# Patient Record
Sex: Male | Born: 1980 | Race: White | Hispanic: No | Marital: Single | Smoking: Never smoker
Health system: Southern US, Community
[De-identification: ages and names within clinical notes are randomized; demographics above are authoritative.]

## PROBLEM LIST (undated history)

## (undated) DIAGNOSIS — F339 Major depressive disorder, recurrent, unspecified: Secondary | ICD-10-CM

## (undated) DIAGNOSIS — B081 Molluscum contagiosum: Secondary | ICD-10-CM

## (undated) DIAGNOSIS — B009 Herpesviral infection, unspecified: Secondary | ICD-10-CM

## (undated) DIAGNOSIS — F1290 Cannabis use, unspecified, uncomplicated: Secondary | ICD-10-CM

## (undated) DIAGNOSIS — B20 Human immunodeficiency virus [HIV] disease: Secondary | ICD-10-CM

## (undated) DIAGNOSIS — Z21 Asymptomatic human immunodeficiency virus [HIV] infection status: Secondary | ICD-10-CM

## (undated) DIAGNOSIS — I10 Essential (primary) hypertension: Secondary | ICD-10-CM

## (undated) HISTORY — DX: Molluscum contagiosum: B08.1

## (undated) HISTORY — DX: Cannabis use, unspecified, uncomplicated: F12.90

## (undated) HISTORY — DX: Asymptomatic human immunodeficiency virus (hiv) infection status: Z21

## (undated) HISTORY — DX: Herpesviral infection, unspecified: B00.9

## (undated) HISTORY — DX: Human immunodeficiency virus (HIV) disease: B20

## (undated) HISTORY — DX: Major depressive disorder, recurrent, unspecified: F33.9

## (undated) HISTORY — DX: Essential (primary) hypertension: I10

---

## 2000-03-11 ENCOUNTER — Emergency Department (HOSPITAL_COMMUNITY): Admission: EM | Admit: 2000-03-11 | Discharge: 2000-03-11 | Payer: Self-pay

## 2000-03-13 ENCOUNTER — Emergency Department (HOSPITAL_COMMUNITY): Admission: EM | Admit: 2000-03-13 | Discharge: 2000-03-13 | Payer: Self-pay

## 2007-03-11 ENCOUNTER — Ambulatory Visit: Payer: Self-pay | Admitting: Infectious Disease

## 2007-03-11 ENCOUNTER — Encounter: Admission: RE | Admit: 2007-03-11 | Discharge: 2007-03-11 | Payer: Self-pay | Admitting: Infectious Disease

## 2007-03-11 DIAGNOSIS — B20 Human immunodeficiency virus [HIV] disease: Secondary | ICD-10-CM | POA: Insufficient documentation

## 2007-03-11 LAB — CONVERTED CEMR LAB
ALT: 16 units/L (ref 0–53)
AST: 18 units/L (ref 0–37)
Albumin: 4.8 g/dL (ref 3.5–5.2)
Alkaline Phosphatase: 61 units/L (ref 39–117)
BUN: 12 mg/dL (ref 6–23)
Basophils Absolute: 0 10*3/uL (ref 0.0–0.1)
Basophils Relative: 0 % (ref 0–1)
Bilirubin Urine: NEGATIVE
CD4 T Cell Abs: 196
CO2: 26 meq/L (ref 19–32)
Calcium: 9.3 mg/dL (ref 8.4–10.5)
Chlamydia, Swab/Urine, PCR: NEGATIVE
Chloride: 106 meq/L (ref 96–112)
Creatinine, Ser: 0.87 mg/dL (ref 0.40–1.50)
Eosinophils Absolute: 0.1 10*3/uL (ref 0.0–0.7)
Eosinophils Relative: 3 % (ref 0–5)
GC Probe Amp, Urine: NEGATIVE
Glucose, Bld: 78 mg/dL (ref 70–99)
HCT: 48 % (ref 39.0–52.0)
HCV Ab: NEGATIVE
HCV Ab: NEGATIVE
HIV 1 RNA Quant: 1220 copies/mL — ABNORMAL HIGH (ref <50)
HIV-1 RNA Quant, Log: 3.09 — ABNORMAL HIGH (ref <1.70)
HIV-1 antibody: POSITIVE — AB
HIV-2 Ab: NEGATIVE
HIV: REACTIVE
Hemoglobin, Urine: NEGATIVE
Hemoglobin: 16.8 g/dL (ref 13.0–17.0)
Hep B Core Total Ab: NEGATIVE
Hep B S Ab: NEGATIVE
Hep B S Ab: NEGATIVE
Hepatitis B Surface Ag: NEGATIVE
Hepatitis B Surface Ag: NEGATIVE
Ketones, ur: NEGATIVE mg/dL
Leukocytes, UA: NEGATIVE
Lymphocytes Relative: 26 % (ref 12–46)
Lymphs Abs: 1.1 10*3/uL (ref 0.7–3.3)
MCHC: 35 g/dL (ref 30.0–36.0)
MCV: 86.8 fL (ref 78.0–100.0)
Monocytes Absolute: 0.6 10*3/uL (ref 0.2–0.7)
Monocytes Relative: 13 % — ABNORMAL HIGH (ref 3–11)
Neutro Abs: 2.6 10*3/uL (ref 1.7–7.7)
Neutrophils Relative %: 59 % (ref 43–77)
Nitrite: NEGATIVE
Platelets: 184 10*3/uL (ref 150–400)
Potassium: 4.1 meq/L (ref 3.5–5.3)
Protein, ur: NEGATIVE mg/dL
RBC: 5.53 M/uL (ref 4.22–5.81)
RDW: 12.8 % (ref 11.5–14.0)
Sodium: 141 meq/L (ref 135–145)
Specific Gravity, Urine: 1.024 (ref 1.005–1.03)
Total Bilirubin: 0.6 mg/dL (ref 0.3–1.2)
Total Protein: 7.9 g/dL (ref 6.0–8.3)
Urine Glucose: NEGATIVE mg/dL
Urobilinogen, UA: 1 (ref 0.0–1.0)
WBC: 4.4 10*3/uL (ref 4.0–10.5)
pH: 7.5 (ref 5.0–8.0)

## 2007-04-01 ENCOUNTER — Ambulatory Visit: Payer: Self-pay | Admitting: Infectious Disease

## 2007-04-01 ENCOUNTER — Ambulatory Visit (HOSPITAL_COMMUNITY): Admission: RE | Admit: 2007-04-01 | Discharge: 2007-04-01 | Payer: Self-pay | Admitting: Infectious Disease

## 2007-04-01 DIAGNOSIS — R05 Cough: Secondary | ICD-10-CM

## 2007-04-01 DIAGNOSIS — L659 Nonscarring hair loss, unspecified: Secondary | ICD-10-CM | POA: Insufficient documentation

## 2007-04-01 DIAGNOSIS — F320 Major depressive disorder, single episode, mild: Secondary | ICD-10-CM

## 2007-04-01 LAB — CONVERTED CEMR LAB: TSH: 0.492 microintl units/mL (ref 0.350–5.50)

## 2007-04-08 ENCOUNTER — Telehealth: Payer: Self-pay | Admitting: Infectious Disease

## 2007-04-12 ENCOUNTER — Ambulatory Visit: Payer: Self-pay | Admitting: Infectious Disease

## 2007-04-26 ENCOUNTER — Telehealth: Payer: Self-pay | Admitting: Infectious Disease

## 2007-05-07 ENCOUNTER — Ambulatory Visit: Payer: Self-pay | Admitting: Infectious Disease

## 2007-05-07 DIAGNOSIS — L259 Unspecified contact dermatitis, unspecified cause: Secondary | ICD-10-CM

## 2007-05-07 DIAGNOSIS — R634 Abnormal weight loss: Secondary | ICD-10-CM

## 2007-05-19 ENCOUNTER — Encounter: Payer: Self-pay | Admitting: Infectious Disease

## 2007-06-24 ENCOUNTER — Telehealth: Payer: Self-pay | Admitting: Infectious Disease

## 2007-07-07 ENCOUNTER — Ambulatory Visit: Payer: Self-pay | Admitting: Infectious Disease

## 2007-07-07 ENCOUNTER — Encounter: Admission: RE | Admit: 2007-07-07 | Discharge: 2007-07-07 | Payer: Self-pay | Admitting: Infectious Disease

## 2007-07-07 LAB — CONVERTED CEMR LAB
Albumin: 5 g/dL (ref 3.5–5.2)
Alkaline Phosphatase: 74 units/L (ref 39–117)
BUN: 13 mg/dL (ref 6–23)
Bilirubin Urine: NEGATIVE
CO2: 25 meq/L (ref 19–32)
Calcium: 9.3 mg/dL (ref 8.4–10.5)
Cholesterol: 210 mg/dL — ABNORMAL HIGH (ref 0–200)
Eosinophils Absolute: 0.1 10*3/uL (ref 0.0–0.7)
Glucose, Bld: 122 mg/dL — ABNORMAL HIGH (ref 70–99)
HCT: 49 % (ref 39.0–52.0)
HDL: 50 mg/dL (ref >39)
HIV 1 RNA Quant: <50 copies/mL (ref <50)
HIV-1 RNA Quant, Log: <1.7 (ref <1.70)
Ketones, ur: NEGATIVE mg/dL
Lymphocytes Relative: 25 % (ref 12–46)
Lymphs Abs: 1.1 10*3/uL (ref 0.7–4.0)
MCV: 90.4 fL (ref 78.0–100.0)
Monocytes Relative: 8 % (ref 3–12)
Neutrophils Relative %: 64 % (ref 43–77)
Platelets: 201 10*3/uL (ref 150–400)
Potassium: 4.1 meq/L (ref 3.5–5.3)
RBC: 5.42 M/uL (ref 4.22–5.81)
Sodium: 140 meq/L (ref 135–145)
Specific Gravity, Urine: 1.017 (ref 1.005–1.03)
Total Protein: 7.9 g/dL (ref 6.0–8.3)
Triglycerides: 148 mg/dL (ref <150)
Urine Glucose: NEGATIVE mg/dL
Urobilinogen, UA: 0.2 (ref 0.0–1.0)
WBC: 4.5 10*3/uL (ref 4.0–10.5)
pH: 6.5 (ref 5.0–8.0)

## 2007-08-23 ENCOUNTER — Encounter (INDEPENDENT_AMBULATORY_CARE_PROVIDER_SITE_OTHER): Payer: Self-pay | Admitting: *Deleted

## 2007-08-23 ENCOUNTER — Encounter: Admission: RE | Admit: 2007-08-23 | Discharge: 2007-08-23 | Payer: Self-pay | Admitting: Infectious Disease

## 2007-08-23 ENCOUNTER — Ambulatory Visit: Payer: Self-pay | Admitting: Infectious Disease

## 2007-08-23 LAB — CONVERTED CEMR LAB
ALT: 16 units/L (ref 0–53)
Basophils Absolute: 0 10*3/uL (ref 0.0–0.1)
CO2: 23 meq/L (ref 19–32)
Calcium: 9.1 mg/dL (ref 8.4–10.5)
Chloride: 106 meq/L (ref 96–112)
Creatinine, Ser: 1.1 mg/dL (ref 0.40–1.50)
Glucose, Bld: 114 mg/dL — ABNORMAL HIGH (ref 70–99)
HCT: 45.9 % (ref 39.0–52.0)
HIV 1 RNA Quant: 58 copies/mL — ABNORMAL HIGH (ref <50)
HIV-1 RNA Quant, Log: 1.76 — ABNORMAL HIGH (ref <1.70)
Hemoglobin: 15.7 g/dL (ref 13.0–17.0)
Lymphocytes Relative: 27 % (ref 12–46)
Lymphs Abs: 1 10*3/uL (ref 0.7–4.0)
Monocytes Absolute: 0.4 10*3/uL (ref 0.1–1.0)
Neutro Abs: 2.4 10*3/uL (ref 1.7–7.7)
RBC: 5.04 M/uL (ref 4.22–5.81)
RDW: 12.9 % (ref 11.5–15.5)
Total Protein: 7.2 g/dL (ref 6.0–8.3)
WBC: 3.8 10*3/uL — ABNORMAL LOW (ref 4.0–10.5)

## 2007-09-09 ENCOUNTER — Encounter (INDEPENDENT_AMBULATORY_CARE_PROVIDER_SITE_OTHER): Payer: Self-pay | Admitting: *Deleted

## 2008-01-10 ENCOUNTER — Ambulatory Visit: Payer: Self-pay | Admitting: Infectious Disease

## 2008-01-10 ENCOUNTER — Encounter: Admission: RE | Admit: 2008-01-10 | Discharge: 2008-01-10 | Payer: Self-pay | Admitting: Infectious Disease

## 2008-01-10 LAB — CONVERTED CEMR LAB
ALT: 14 units/L (ref 0–53)
AST: 15 units/L (ref 0–37)
Albumin: 4.9 g/dL (ref 3.5–5.2)
Basophils Absolute: 0 10*3/uL (ref 0.0–0.1)
Basophils Relative: 1 % (ref 0–1)
Calcium: 9.2 mg/dL (ref 8.4–10.5)
Chloride: 105 meq/L (ref 96–112)
HIV 1 RNA Quant: 53 copies/mL — ABNORMAL HIGH (ref <50)
HIV-1 RNA Quant, Log: 1.72 — ABNORMAL HIGH (ref <1.70)
Lymphocytes Relative: 24 % (ref 12–46)
MCHC: 35.5 g/dL (ref 30.0–36.0)
Neutro Abs: 2.7 10*3/uL (ref 1.7–7.7)
Neutrophils Relative %: 62 % (ref 43–77)
Potassium: 4.1 meq/L (ref 3.5–5.3)
RDW: 12.5 % (ref 11.5–15.5)
Sodium: 140 meq/L (ref 135–145)
Total CHOL/HDL Ratio: 3.7
Total Protein: 7.4 g/dL (ref 6.0–8.3)

## 2008-01-24 ENCOUNTER — Ambulatory Visit: Payer: Self-pay | Admitting: Infectious Disease

## 2008-06-11 IMAGING — CR DG CHEST 2V
2 series · 2 of 2 positions shown · non-contrast
Comparison: none

CLINICAL DATA: Productive cough for several weeks. 
 CHEST - 2 VIEW:

[w chest pa]
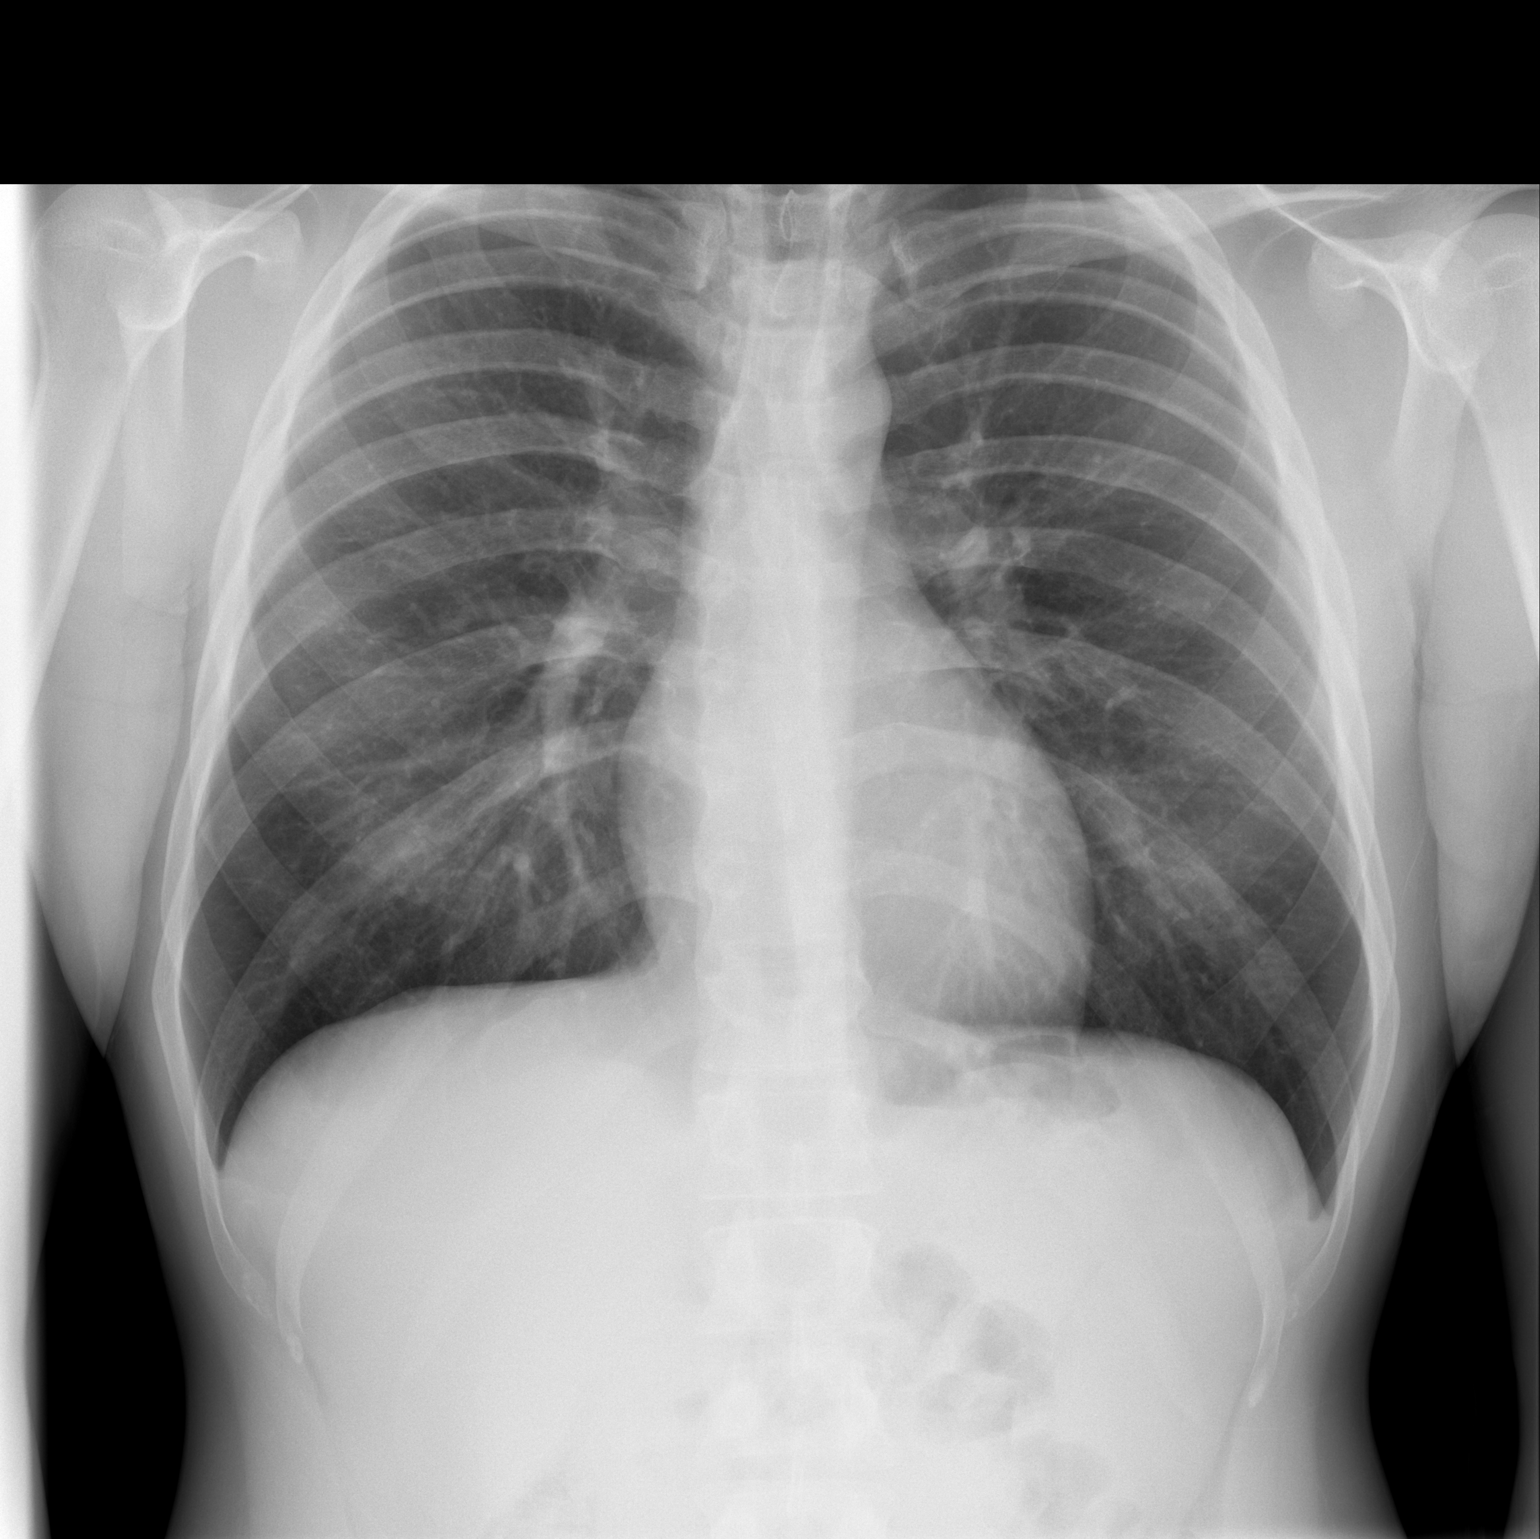

[w chest lat]
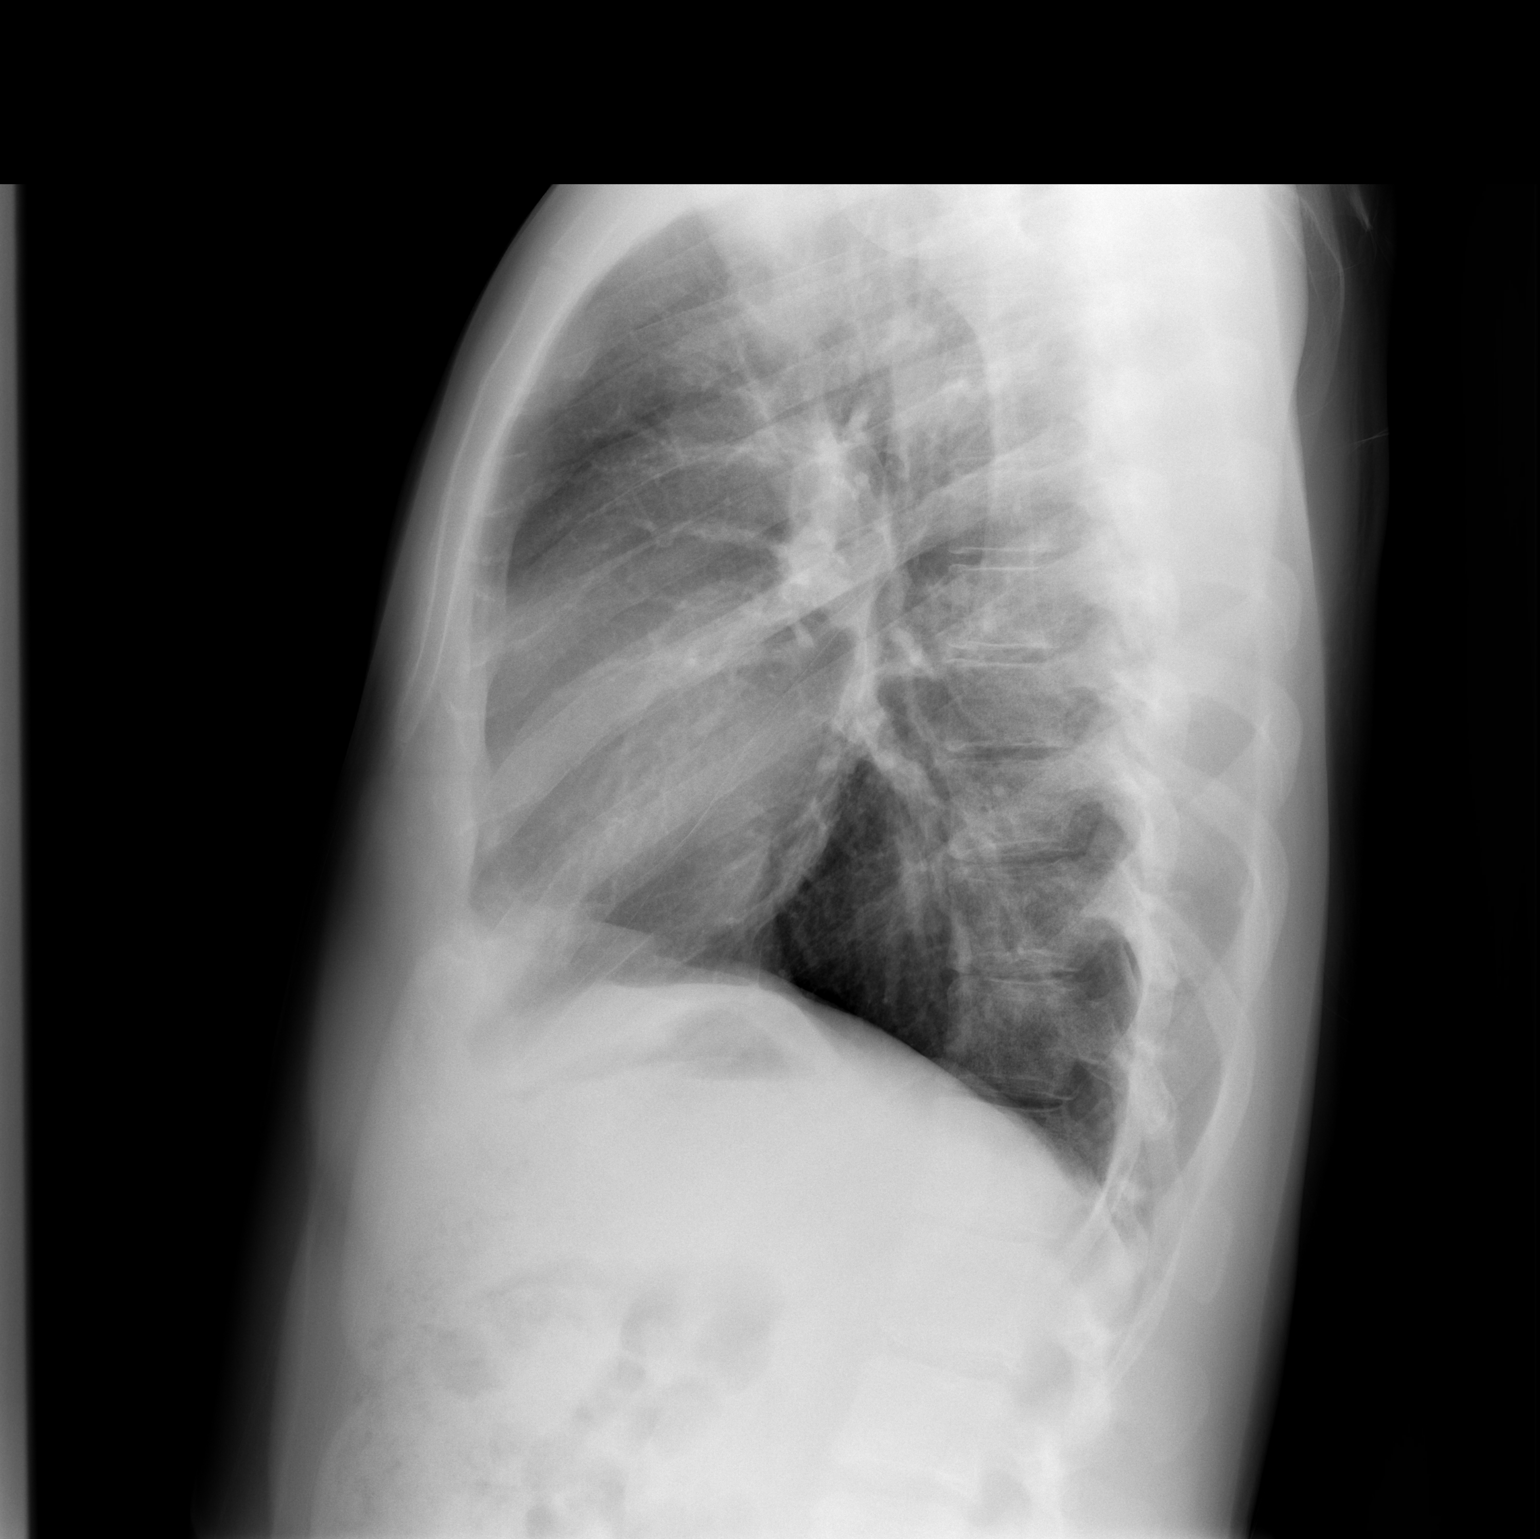

[2 of 2 positions shown; findings below may reference images not displayed]

FINDINGS: Two views of the chest show no active infiltrate or effusion. No adenopathy is seen.  The heart is within normal limits in size.
IMPRESSION: No active lung disease.

## 2008-06-19 ENCOUNTER — Ambulatory Visit: Payer: Self-pay | Admitting: Infectious Disease

## 2008-06-19 ENCOUNTER — Encounter (INDEPENDENT_AMBULATORY_CARE_PROVIDER_SITE_OTHER): Payer: Self-pay | Admitting: *Deleted

## 2008-06-19 LAB — CONVERTED CEMR LAB
ALT: 10 units/L (ref 0–53)
Basophils Absolute: 0 10*3/uL (ref 0.0–0.1)
CO2: 26 meq/L (ref 19–32)
Calcium: 9.2 mg/dL (ref 8.4–10.5)
Chloride: 105 meq/L (ref 96–112)
Creatinine, Ser: 0.93 mg/dL (ref 0.40–1.50)
HIV 1 RNA Quant: 57 copies/mL — ABNORMAL HIGH (ref <48)
Hemoglobin: 16.3 g/dL (ref 13.0–17.0)
Lymphocytes Relative: 26 % (ref 12–46)
Monocytes Absolute: 0.5 10*3/uL (ref 0.1–1.0)
Neutro Abs: 2.7 10*3/uL (ref 1.7–7.7)
RBC: 5.27 M/uL (ref 4.22–5.81)
RDW: 12.7 % (ref 11.5–15.5)
Total Protein: 7.4 g/dL (ref 6.0–8.3)
WBC: 4.5 10*3/uL (ref 4.0–10.5)

## 2008-07-12 ENCOUNTER — Encounter (INDEPENDENT_AMBULATORY_CARE_PROVIDER_SITE_OTHER): Payer: Self-pay | Admitting: *Deleted

## 2008-11-09 ENCOUNTER — Ambulatory Visit: Payer: Self-pay | Admitting: Infectious Disease

## 2008-11-09 LAB — CONVERTED CEMR LAB
AST: 17 units/L (ref 0–37)
Alkaline Phosphatase: 63 units/L (ref 39–117)
BUN: 12 mg/dL (ref 6–23)
Creatinine, Ser: 1.42 mg/dL (ref 0.40–1.50)
Eosinophils Absolute: 0.1 10*3/uL (ref 0.0–0.7)
Eosinophils Relative: 2 % (ref 0–5)
HCT: 43.5 % (ref 39.0–52.0)
HIV-1 RNA Quant, Log: <1.68 (ref <1.68)
Lymphs Abs: 1.2 10*3/uL (ref 0.7–4.0)
MCV: 89.7 fL (ref 78.0–100.0)
Monocytes Relative: 10 % (ref 3–12)
Potassium: 4.1 meq/L (ref 3.5–5.3)
RBC: 4.85 M/uL (ref 4.22–5.81)
Total Bilirubin: 0.3 mg/dL (ref 0.3–1.2)
WBC: 4.5 10*3/uL (ref 4.0–10.5)

## 2008-11-27 ENCOUNTER — Ambulatory Visit: Payer: Self-pay | Admitting: Infectious Disease

## 2008-11-27 DIAGNOSIS — R221 Localized swelling, mass and lump, neck: Secondary | ICD-10-CM

## 2008-11-27 DIAGNOSIS — R220 Localized swelling, mass and lump, head: Secondary | ICD-10-CM | POA: Insufficient documentation

## 2008-11-27 DIAGNOSIS — R6882 Decreased libido: Secondary | ICD-10-CM

## 2008-11-27 LAB — CONVERTED CEMR LAB
Chlamydia, Swab/Urine, PCR: NEGATIVE
GC Probe Amp, Urine: NEGATIVE

## 2009-06-27 ENCOUNTER — Telehealth: Payer: Self-pay | Admitting: Infectious Disease

## 2009-07-04 ENCOUNTER — Encounter (INDEPENDENT_AMBULATORY_CARE_PROVIDER_SITE_OTHER): Payer: Self-pay | Admitting: *Deleted

## 2009-07-18 ENCOUNTER — Encounter (INDEPENDENT_AMBULATORY_CARE_PROVIDER_SITE_OTHER): Payer: Self-pay | Admitting: *Deleted

## 2009-09-25 ENCOUNTER — Encounter (INDEPENDENT_AMBULATORY_CARE_PROVIDER_SITE_OTHER): Payer: Self-pay | Admitting: *Deleted

## 2009-12-27 ENCOUNTER — Telehealth (INDEPENDENT_AMBULATORY_CARE_PROVIDER_SITE_OTHER): Payer: Self-pay | Admitting: *Deleted

## 2009-12-28 ENCOUNTER — Encounter: Payer: Self-pay | Admitting: Infectious Disease

## 2010-01-01 ENCOUNTER — Ambulatory Visit: Payer: Self-pay | Admitting: Infectious Disease

## 2010-01-01 LAB — CONVERTED CEMR LAB
ALT: 13 units/L (ref 0–53)
AST: 16 units/L (ref 0–37)
Albumin: 4.8 g/dL (ref 3.5–5.2)
Alkaline Phosphatase: 70 units/L (ref 39–117)
Basophils Absolute: 0 10*3/uL (ref 0.0–0.1)
Eosinophils Absolute: 0.1 10*3/uL (ref 0.0–0.7)
Glucose, Bld: 94 mg/dL (ref 70–99)
HIV 1 RNA Quant: <48 copies/mL (ref <48)
HIV-1 RNA Quant, Log: <1.68 (ref <1.68)
LDL Cholesterol: 89 mg/dL (ref 0–99)
Lymphs Abs: 1.1 10*3/uL (ref 0.7–4.0)
MCV: 91.6 fL (ref 78.0–100.0)
Neutrophils Relative %: 58 % (ref 43–77)
Platelets: 189 10*3/uL (ref 150–400)
Potassium: 4.1 meq/L (ref 3.5–5.3)
RDW: 12.6 % (ref 11.5–15.5)
Sodium: 140 meq/L (ref 135–145)
Total Bilirubin: 0.4 mg/dL (ref 0.3–1.2)
Total Protein: 7.2 g/dL (ref 6.0–8.3)
Triglycerides: 79 mg/dL (ref <150)
VLDL: 16 mg/dL (ref 0–40)
WBC: 3.8 10*3/uL — ABNORMAL LOW (ref 4.0–10.5)

## 2010-02-20 ENCOUNTER — Telehealth (INDEPENDENT_AMBULATORY_CARE_PROVIDER_SITE_OTHER): Payer: Self-pay | Admitting: *Deleted

## 2010-05-29 ENCOUNTER — Encounter (INDEPENDENT_AMBULATORY_CARE_PROVIDER_SITE_OTHER): Payer: Self-pay | Admitting: *Deleted

## 2010-06-27 ENCOUNTER — Encounter (INDEPENDENT_AMBULATORY_CARE_PROVIDER_SITE_OTHER): Payer: Self-pay | Admitting: *Deleted

## 2010-07-11 ENCOUNTER — Encounter (INDEPENDENT_AMBULATORY_CARE_PROVIDER_SITE_OTHER): Payer: Self-pay | Admitting: *Deleted

## 2010-07-16 NOTE — Progress Notes (Signed)
Summary: refill/mld  Phone Note Call from Patient   Caller: Patient Reason for Call: Refill Medication Summary of Call: Patient called because his Atripla said it has no refills left on the prescription.  Patient is requesting refills.  He is an ADAP patient. Initial call taken by: Paulo Fruit  BS,CPht II,MPH,  February 20, 2010 9:35 AM    Prescriptions: ATRIPLA 600-200-300 MG  TABS (EFAVIRENZ-EMTRICITAB-TENOFOVIR) Take 1 tablet by mouth once a day  #30 x 3   Entered by:   Paulo Fruit  BS,CPht II,MPH   Authorized by:   Acey Lav MD   Signed by:   Paulo Fruit  BS,CPht II,MPH on 02/20/2010   Method used:   Electronically to        [facility] [phone]* (retail)       [address]       [place], [place]  [address]       Ph: [phone]       Fax:    RxID:   [id number]  Paulo Fruit  BS,CPht II,MPH  February 20, 2010 9:36 AM

## 2010-07-16 NOTE — Miscellaneous (Signed)
Summary: RW Update  Clinical Lists Changes  Observations: Added new observation of HIV STATUS: CDC-defined AIDS (07/18/2009 16:54)

## 2010-07-16 NOTE — Progress Notes (Signed)
Summary: COncerns by pt  Phone Note Outgoing Call   Call placed by: Acey Lav MD,  June 27, 2009 7:01 PM Action Taken: Information Sent Details for Reason: discuss complaints Summary of Call: I discussed several complaints the pt had with re to our clinic.  Specific complaint with re to me personally #1 that I told him that he could take atripla with food: This pharmacy recommendation is to reduce intensity of side effects of meds incluidng vivid dreams and GI upset. 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 I am not able to excise tongue lesions  He is interested in a different clinic and I advised him to choose between [facility] (he has had some conflicts there), [place], [facility], [facility] or [facility]. The most importannt thing he needs to do is plug in with ADAP. Initial call taken by: Acey Lav MD,  June 27, 2009 7:04 PM

## 2010-07-16 NOTE — Miscellaneous (Signed)
Summary: clinical update/ryan white NCADAP application completed  Clinical Lists Changes  Observations: Added new observation of PCTFPL: 229.03  (07/04/2009 14:15) Added new observation of HOUSEINCOME: 32355  (07/04/2009 14:15) Added new observation of FINASSESSDT: 06/19/2009  (07/04/2009 14:15) Added new observation of YEARLYEXPEN: 3000  (07/04/2009 14:15) Added new observation of RW VITAL STA: Active  (07/04/2009 14:15)

## 2010-07-16 NOTE — Miscellaneous (Signed)
Summary: Orders Update - lab/RPR  Clinical Lists Changes  Orders: Added new Test order of T-RPR (Syphilis) [phone]) - Signed     Process Orders Check Orders Results:     [facility]: ABN not required for this insurance Order queued for requisitioning for [facility]: December 28, 2009 11:55 AM  Tests Sent for requisitioning (December 28, 2009 11:55 AM):     01/01/2010: [facility] -- T-RPR (Syphilis) [phone] (signed)

## 2010-07-16 NOTE — Miscellaneous (Signed)
Summary: clinical update/ryan white ncadap apprv til 09/14/10  Clinical Lists Changes  Observations: Added new observation of AIDSDAP: Yes 2011 (09/25/2009 10:50)

## 2010-07-16 NOTE — Progress Notes (Signed)
Summary: NCADAP office calling about pt whereabouts  Phone Note Outgoing Call   Caller: adap office  Summary of Call: Modena Morrow from the ADAP office called stating that patient is receiving medications in [place], [place].  12/01/09, etc. They want to know if he has moved to [facility].  If this is the case he will no longer receive medication through NCADAP.  He will need to apply to the [facility] ADAP program.  Stanton Kidney asked me to contact him and to call her back at l [phone] with the status of his whereabouts. Initial call taken by: Paulo Fruit  BS,CPht II,MPH,  December 27, 2009 4:09 PM Call placed by: Paulo Fruit  BS,CPht II,MPH,  December 27, 2009 4:19 PM Call placed to: Patient Reason for Call: Get patient information Details for Reason: to verify wherabouts Summary of Call: I spoke with patient to see if he is living out-of-state.  Paitent said no he was visiting in [place] from about 2 to 3 weeks and was going to run out-of-medications.  He had his medications shipped to where he was visiting.  He said he has an appoint with our clinic in a few weeks.  I told him I just wanted to make sure that he is receiving everything alright and if he should move to make sure he informs [place] even if he is going out-of-state for a vacation.  Follow-up for Phone Call        I spoke with Modena Morrow at the [facility] ADAP office to let her know that this patient is still a patient with our office and was just visiting in [place] at the time his order was delivered. Follow-up by: Paulo Fruit  BS,CPht II,MPH,  December 27, 2009 4:25 PM

## 2010-07-18 NOTE — Miscellaneous (Signed)
Summary: ADAP update  Clinical Lists Changes  Observations: Added new observation of AIDSDAP: Yes 2012 (07/11/2010 16:06)

## 2010-07-18 NOTE — Miscellaneous (Signed)
Summary: RW Finanancial Update   Clinical Lists Changes  Observations: Added new observation of AIDSDAP: Pending (06/27/2010 16:34) Added new observation of PCTFPL: 134.28  (06/27/2010 16:34) Added new observation of HOUSEINCOME: 16109  (06/27/2010 16:34) Added new observation of FINASSESSDT: 06/27/2010  (06/27/2010 16:34) Added new observation of YEARLYEXPEN: 0  (06/27/2010 16:34)

## 2010-07-18 NOTE — Miscellaneous (Signed)
  Clinical Lists Changes  Observations: Added new observation of YEARAIDSPOS: 2008  (05/29/2010 11:01)

## 2010-07-31 ENCOUNTER — Encounter (INDEPENDENT_AMBULATORY_CARE_PROVIDER_SITE_OTHER): Payer: Self-pay | Admitting: *Deleted

## 2010-08-07 NOTE — Miscellaneous (Signed)
  Clinical Lists Changes 

## 2010-08-19 ENCOUNTER — Other Ambulatory Visit: Payer: Self-pay | Admitting: Infectious Disease

## 2010-08-19 ENCOUNTER — Other Ambulatory Visit (INDEPENDENT_AMBULATORY_CARE_PROVIDER_SITE_OTHER): Payer: Self-pay

## 2010-08-19 ENCOUNTER — Encounter (INDEPENDENT_AMBULATORY_CARE_PROVIDER_SITE_OTHER): Payer: Self-pay | Admitting: *Deleted

## 2010-08-19 ENCOUNTER — Encounter: Payer: Self-pay | Admitting: Infectious Disease

## 2010-08-19 DIAGNOSIS — B20 Human immunodeficiency virus [HIV] disease: Secondary | ICD-10-CM

## 2010-08-19 LAB — CONVERTED CEMR LAB
ALT: 13 units/L (ref 0–53)
BUN: 16 mg/dL (ref 6–23)
CO2: 27 meq/L (ref 19–32)
Calcium: 9.7 mg/dL (ref 8.4–10.5)
Chloride: 104 meq/L (ref 96–112)
Creatinine, Ser: 1.09 mg/dL (ref 0.40–1.50)
Eosinophils Absolute: 0.1 10*3/uL (ref 0.0–0.7)
Eosinophils Relative: 1 % (ref 0–5)
Glucose, Bld: 67 mg/dL — ABNORMAL LOW (ref 70–99)
HCT: 48 % (ref 39.0–52.0)
HIV 1 RNA Quant: <20 copies/mL (ref <20)
HIV-1 RNA Quant, Log: <1.3 (ref <1.30)
Lymphocytes Relative: 26 % (ref 12–46)
Lymphs Abs: 1.3 10*3/uL (ref 0.7–4.0)
MCV: 93 fL (ref 78.0–100.0)
Monocytes Relative: 12 % (ref 3–12)
Neutrophils Relative %: 60 % (ref 43–77)
Platelets: 196 10*3/uL (ref 150–400)
RBC: 5.16 M/uL (ref 4.22–5.81)
Total Bilirubin: 0.5 mg/dL (ref 0.3–1.2)
WBC: 4.9 10*3/uL (ref 4.0–10.5)

## 2010-08-20 LAB — T-HELPER CELL (CD4) - (RCID CLINIC ONLY): CD4 T Cell Abs: 410 uL (ref 400–2700)

## 2010-09-02 ENCOUNTER — Encounter: Payer: Self-pay | Admitting: Infectious Disease

## 2010-09-02 ENCOUNTER — Ambulatory Visit (INDEPENDENT_AMBULATORY_CARE_PROVIDER_SITE_OTHER): Payer: Self-pay | Admitting: Infectious Disease

## 2010-09-02 DIAGNOSIS — B20 Human immunodeficiency virus [HIV] disease: Secondary | ICD-10-CM

## 2010-09-02 DIAGNOSIS — F320 Major depressive disorder, single episode, mild: Secondary | ICD-10-CM

## 2010-09-02 DIAGNOSIS — L659 Nonscarring hair loss, unspecified: Secondary | ICD-10-CM

## 2010-09-02 LAB — CONVERTED CEMR LAB: GC Probe Amp, Urine: NEGATIVE

## 2010-09-12 NOTE — Assessment & Plan Note (Signed)
Summary: 31month f/u   Vital Signs:  Patient profile:   30 year old male Weight:      153.50 pounds (69.77 kg) BMI:     23.42 Temp:     98 degrees F (36.67 degrees C) oral Pulse rate:   93 / minute BP sitting:   130 / 90  (left arm)  Vitals Entered By: Starleen Arms CMA (September 02, 2010 10:43 AM) CC: f/u Is Patient Diabetic? No Pain Assessment Patient in pain? no      Nutritional Status BMI of 19 -24 = normal  Does patient need assistance? Functional Status Self care Ambulation Normal   Visit Type:  Follow-up Primary Provider:  Paulette Blanch Dam MD  CC:  f/u.  History of Present Illness: 30 year old Caucasian male, with HIV well supressed on [place]. He only comes to clinic once a year and asks if he can come every 5 years! He claims high adherence with meds adn his undetectable viral load and healthy cd4 count reflect that. He still has problems with mood swings and occasionally at work with concentration. He has an HIV negative partner with whom he lives but of whom h e is suspicous that the partner is cheating on him. I offered him antidepressants but he refused he denies overtly being depressed or si or hi.  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 Take 1 Tablet By Mouth Once A Day 2)  Propecia 1 Mg  Tabs (Finasteride) .... Take 1 Tablet By Mouth Once A Day 3)  Zoloft 100 Mg Tabs (Sertraline Hcl) .... Take One Half Tablet Once Daily For Two Weeks and Then Increase To One Whole Tablet  Current Medications (verified): 1)   Atripla 600-200-300 Mg  Tabs (Efavirenz-Emtricitab-Tenofovir) .... Take 1 Tablet By Mouth Once A Day  Allergies: 1)  ! Pcn  Past History:  Past Medical History: Last updated: 01/24/2008 HIV Anal warts rx with aldara several years ago Alopecia  Past Surgical History: Last updated: 04/01/2007 NOne  Family History: Last updated: 01/24/2008 Mom with ESRD DAd with LUng cancer, smoker, early CAD.  Social History: Last updated: 04/01/2007 Occupation:hair stylist Single LIves with friend (not partner) Never Smoked Drug use-yes marijuana, but no IVDU, no cocaine Travel hx up and down [place], [place], [place] briefly, no foreign travel  Risk Factors: Alcohol Use: 0 (11/27/2008)  Risk Factors: Smoking Status: never (11/27/2008)  Family History: Reviewed history from 01/24/2008 and no changes required. Mom with ESRD DAd with LUng cancer, smoker, early CAD.  Social History: Reviewed history from 04/01/2007 and no changes required. 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   Eyes:  vision grossly intact, pupils equal, pupils round,  and pupils reactive to light.   Ears:  no external deformities.   Nose:  no external deformity.   Lungs:  normal respiratory effort, no crackles, and no wheezes.   Heart:  normal rate, regular rhythm, no murmur, no gallop, and no rub.   Abdomen:  soft, non-tender, normal bowel sounds, no distention, and no masses.   Neurologic:  alert & oriented X3.   Psych:  Oriented X3.  depressed affect.          Medication Adherence: 09/02/2010    Adherence to medications reviewed with patient. Counseling to provide adequate adherence provided   Prevention For Positives: 09/02/2010   Safe sex practices discussed with patient. Condoms offered.   Education Materials Provided: 09/02/2010 Safe sex practices discussed with patient. Condoms offered.                          Impression & Recommendations:  Problem # 1:  HIV INFECTION (ICD-042) perfect control, bring back in flu season for repeat labs Orders: T-GC Probe, urine [phone]) T-Chlamydia  Probe, urine ([id number]) Est. Patient Level IV (99214)Future Orders: T-CD4SP ([facility]) (CD4SP) ... 03/01/2011 T-HIV Viral Load [phone]) ... 03/01/2011 T-CBC w/Diff ([id number]) ... 03/01/2011 T-Comprehensive Metabolic Panel [phone]) ... 03/01/2011 T-RPR (Syphilis) [phone]) ... 03/01/2011 T-Lipid Profile ([phone]) ... 03/01/2011  Problem # 2:  MALE PATTERN BALDNESS (ICD-704.00)  didnt want to use the propecia  Orders: Est. 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 Patient Level IV (84166)  Patient Instructions: 1)  we will give you flu shot today 2)  rtc in September for flu shot for next year and labs 3)  rtc in September    Orders Added: 1)  T-GC Probe, urine [phone] 2)  T-Chlamydia  Probe, urine [phone] 3)  T-CD4SP ([facility]) [CD4SP] 4)  T-HIV Viral Load [phone] 5)  T-CBC w/Diff [62831-51761] 6)  T-Comprehensive Metabolic Panel [80053-22900] 7)  T-RPR (Syphilis) [60737-10626] 8)  T-Lipid Profile [80061-22930] 9)  Est. Patient Level IV [94854]   Immunization History:  Influenza Immunization History:    Influenza:  fluvax 3+ (09/02/2010)   Immunization History:  Influenza Immunization History:    Influenza:  Fluvax 3+ (09/02/2010)

## 2010-09-24 LAB — T-HELPER CELL (CD4) - (RCID CLINIC ONLY)
CD4 % Helper T Cell: 39 % (ref 33–55)
CD4 T Cell Abs: 440 uL (ref 400–2700)

## 2010-09-30 LAB — T-HELPER CELL (CD4) - (RCID CLINIC ONLY): CD4 % Helper T Cell: 31 % — ABNORMAL LOW (ref 33–55)

## 2010-12-31 ENCOUNTER — Ambulatory Visit: Payer: Self-pay

## 2011-03-06 LAB — T-HELPER CELL (CD4) - (RCID CLINIC ONLY)
CD4 % Helper T Cell: 23 — ABNORMAL LOW
CD4 T Cell Abs: 240 — ABNORMAL LOW

## 2011-03-10 LAB — T-HELPER CELL (CD4) - (RCID CLINIC ONLY)
CD4 % Helper T Cell: 31 — ABNORMAL LOW
CD4 T Cell Abs: 330 — ABNORMAL LOW

## 2011-03-27 LAB — T-HELPER CELL (CD4) - (RCID CLINIC ONLY)
CD4 % Helper T Cell: 24 — ABNORMAL LOW
CD4 T Cell Abs: 210 — ABNORMAL LOW

## 2011-04-08 ENCOUNTER — Other Ambulatory Visit (INDEPENDENT_AMBULATORY_CARE_PROVIDER_SITE_OTHER): Payer: Self-pay

## 2011-04-08 ENCOUNTER — Other Ambulatory Visit: Payer: Self-pay | Admitting: Infectious Disease

## 2011-04-08 DIAGNOSIS — B20 Human immunodeficiency virus [HIV] disease: Secondary | ICD-10-CM

## 2011-04-08 LAB — CBC WITH DIFFERENTIAL/PLATELET
Eosinophils Relative: 3 % (ref 0–5)
HCT: 44.5 % (ref 39.0–52.0)
Lymphocytes Relative: 24 % (ref 12–46)
Lymphs Abs: 1 10*3/uL (ref 0.7–4.0)
MCV: 91 fL (ref 78.0–100.0)
Monocytes Absolute: 0.5 10*3/uL (ref 0.1–1.0)
Monocytes Relative: 10 % (ref 3–12)
RBC: 4.89 MIL/uL (ref 4.22–5.81)
RDW: 12.3 % (ref 11.5–15.5)
WBC: 4.4 10*3/uL (ref 4.0–10.5)

## 2011-04-08 LAB — COMPREHENSIVE METABOLIC PANEL
AST: 15 U/L (ref 0–37)
Albumin: 5.1 g/dL (ref 3.5–5.2)
Alkaline Phosphatase: 64 U/L (ref 39–117)
BUN: 12 mg/dL (ref 6–23)
Potassium: 4 mEq/L (ref 3.5–5.3)

## 2011-04-23 ENCOUNTER — Encounter: Payer: Self-pay | Admitting: Infectious Disease

## 2011-04-23 ENCOUNTER — Other Ambulatory Visit: Payer: Self-pay | Admitting: Infectious Disease

## 2011-07-15 ENCOUNTER — Other Ambulatory Visit: Payer: Self-pay | Admitting: Licensed Clinical Social Worker

## 2011-07-15 ENCOUNTER — Other Ambulatory Visit: Payer: Self-pay | Admitting: *Deleted

## 2011-07-15 DIAGNOSIS — B20 Human immunodeficiency virus [HIV] disease: Secondary | ICD-10-CM

## 2011-07-15 MED ORDER — EFAVIRENZ-EMTRICITAB-TENOFOVIR 600-200-300 MG PO TABS: 1.0000 | ORAL_TABLET | Freq: Every day | ORAL | Status: DC

## 2011-07-22 ENCOUNTER — Ambulatory Visit: Payer: Self-pay

## 2011-07-23 ENCOUNTER — Ambulatory Visit: Payer: Self-pay

## 2011-07-23 ENCOUNTER — Ambulatory Visit (INDEPENDENT_AMBULATORY_CARE_PROVIDER_SITE_OTHER): Payer: Self-pay | Admitting: Infectious Disease

## 2011-07-23 ENCOUNTER — Encounter: Payer: Self-pay | Admitting: Infectious Disease

## 2011-07-23 VITALS — BP 137/88 | HR 79 | Temp 98.2°F | Wt 150.0 lb

## 2011-07-23 DIAGNOSIS — F320 Major depressive disorder, single episode, mild: Secondary | ICD-10-CM

## 2011-07-23 DIAGNOSIS — B20 Human immunodeficiency virus [HIV] disease: Secondary | ICD-10-CM

## 2011-07-23 NOTE — Assessment & Plan Note (Signed)
He feels he is in good spirits and is in no need of antidepressants.

## 2011-07-23 NOTE — Assessment & Plan Note (Signed)
Continue atripla 

## 2011-07-23 NOTE — Progress Notes (Signed)
  Subjective:    Patient ID: Lawrence Bowen, male    DOB: 02-20-1981, 31 y.o.   MRN: [id number]  HPI  Lawrence Bowen is a 31 y.o. male who is doing superbly well on his  antiviral regimen ( atripla), with undetectable viral load and health cd4 count. At times he states that he has problems with "ornery. However he is in relatively good spirits he denies being depressed he is not interested in an antidepressant. He is doing well at his job he is contemplating getting insurance but concerned about potential high co-pays for his antiretroviral medications. He is renewing his age drug assistance program application.   Review of Systems  Constitutional: Negative for fever, chills, diaphoresis, activity change, appetite change, fatigue and unexpected weight change.  HENT: Negative for congestion, sore throat, rhinorrhea, sneezing, trouble swallowing and sinus pressure.   Eyes: Negative for photophobia and visual disturbance.  Respiratory: Negative for cough, chest tightness, shortness of breath, wheezing and stridor.   Cardiovascular: Negative for chest pain, palpitations and leg swelling.  Gastrointestinal: Negative for nausea, vomiting, abdominal pain, diarrhea, constipation, blood in stool, abdominal distention and anal bleeding.  Genitourinary: Negative for dysuria, hematuria, flank pain and difficulty urinating.  Musculoskeletal: Negative for myalgias, back pain, joint swelling, arthralgias and gait problem.  Skin: Negative for color change, pallor, rash and wound.  Neurological: Negative for dizziness, tremors, weakness and light-headedness.  Hematological: Negative for adenopathy. Does not bruise/bleed easily.  Psychiatric/Behavioral: Negative for behavioral problems, confusion, sleep disturbance, dysphoric mood, decreased concentration and agitation.       Objective:   Physical Exam  Constitutional: He is oriented to person, place, and time. He appears well-developed and well-nourished.  No distress.  HENT:  Head: Normocephalic and atraumatic.  Mouth/Throat: Oropharynx is clear and moist. No oropharyngeal exudate.  Eyes: Conjunctivae and EOM are normal. Pupils are equal, round, and reactive to light. No scleral icterus.  Neck: Normal range of motion. Neck supple. No JVD present.  Cardiovascular: Normal rate, regular rhythm and normal heart sounds.  Exam reveals no gallop and no friction rub.   No murmur heard. Pulmonary/Chest: Effort normal and breath sounds normal. No respiratory distress. He has no wheezes. He has no rales. He exhibits no tenderness.  Abdominal: He exhibits no distension and no mass. There is no tenderness. There is no rebound and no guarding.  Musculoskeletal: He exhibits no edema and no tenderness.  Lymphadenopathy:    He has no cervical adenopathy.  Neurological: He is alert and oriented to person, place, and time. He has normal reflexes. He exhibits normal muscle tone. Coordination normal.  Skin: Skin is warm and dry. He is not diaphoretic. No erythema. No pallor.  Psychiatric: He has a normal mood and affect. His behavior is normal. Judgment and thought content normal.          Assessment & Plan:  HIV INFECTION Continue atripla  DEPRESSIVE DSORD, MAJOR SNGL EPSD, MILE He feels he is in good spirits and is in no need of antidepressants.

## 2011-09-11 ENCOUNTER — Telehealth: Payer: Self-pay

## 2011-09-11 NOTE — Telephone Encounter (Signed)
Patient requested ADAP approval letter be faxed to his employer's insurance co. - advised it would be best if he faxed it to them himself. He said I could mail it to him and did so today.

## 2012-01-20 ENCOUNTER — Ambulatory Visit: Payer: Self-pay

## 2012-02-17 ENCOUNTER — Encounter: Payer: Self-pay | Admitting: *Deleted

## 2012-02-17 NOTE — Telephone Encounter (Signed)
error 

## 2012-04-30 ENCOUNTER — Other Ambulatory Visit: Payer: Self-pay

## 2012-04-30 DIAGNOSIS — B20 Human immunodeficiency virus [HIV] disease: Secondary | ICD-10-CM

## 2012-04-30 LAB — LIPID PANEL
Total CHOL/HDL Ratio: 3.3 Ratio
VLDL: 15 mg/dL (ref 0–40)

## 2012-04-30 LAB — T-HELPER CELL (CD4) - (RCID CLINIC ONLY)
CD4 % Helper T Cell: 33 % (ref 33–55)
CD4 T Cell Abs: 440 uL (ref 400–2700)

## 2012-04-30 LAB — CBC WITH DIFFERENTIAL/PLATELET
Basophils Absolute: 0 10*3/uL (ref 0.0–0.1)
Basophils Relative: 0 % (ref 0–1)
Eosinophils Absolute: 0.1 10*3/uL (ref 0.0–0.7)
MCH: 32.2 pg (ref 26.0–34.0)
MCHC: 35.4 g/dL (ref 30.0–36.0)
Neutro Abs: 2.7 10*3/uL (ref 1.7–7.7)
Neutrophils Relative %: 58 % (ref 43–77)
RDW: 12.7 % (ref 11.5–15.5)

## 2012-05-01 LAB — COMPLETE METABOLIC PANEL WITH GFR
ALT: 18 U/L (ref 0–53)
AST: 21 U/L (ref 0–37)
Alkaline Phosphatase: 63 U/L (ref 39–117)
BUN: 11 mg/dL (ref 6–23)
Creat: 0.9 mg/dL (ref 0.50–1.35)
GFR, Est Non African American: >89 mL/min
Total Bilirubin: 0.5 mg/dL (ref 0.3–1.2)

## 2012-05-01 LAB — RPR

## 2012-05-03 LAB — HIV-1 RNA QUANT-NO REFLEX-BLD
HIV 1 RNA Quant: <20 copies/mL (ref <20)
HIV-1 RNA Quant, Log: <1.3 {Log} (ref <1.30)

## 2012-06-23 ENCOUNTER — Encounter: Payer: Self-pay | Admitting: Infectious Disease

## 2012-06-23 ENCOUNTER — Ambulatory Visit (INDEPENDENT_AMBULATORY_CARE_PROVIDER_SITE_OTHER): Payer: Self-pay | Admitting: Infectious Disease

## 2012-06-23 VITALS — BP 133/84 | HR 88 | Temp 98.4°F | Wt 150.0 lb

## 2012-06-23 DIAGNOSIS — Z23 Encounter for immunization: Secondary | ICD-10-CM

## 2012-06-23 DIAGNOSIS — B081 Molluscum contagiosum: Secondary | ICD-10-CM

## 2012-06-23 DIAGNOSIS — B20 Human immunodeficiency virus [HIV] disease: Secondary | ICD-10-CM

## 2012-06-23 DIAGNOSIS — B009 Herpesviral infection, unspecified: Secondary | ICD-10-CM | POA: Insufficient documentation

## 2012-06-23 MED ORDER — VALACYCLOVIR HCL 1 G PO TABS: 1000.0000 mg | ORAL_TABLET | Freq: Two times a day (BID) | ORAL | Status: DC

## 2012-06-23 NOTE — Progress Notes (Signed)
Subjective:    Patient ID: Lawrence Bowen, male    DOB: Mar 07, 1981, 32 y.o.   MRN: [id number]  HPI  Lawrence Bowen is a 32 y.o. male who is doing superbly well on his  antiviral regimen, atripla with undetectable viral load and healthy cd4 count. He comes today with a few different concerns.  First of all he has developed painless umbilicated papular lesions between his index and middle finger. He is afraid that this is do to his possibly having contracted varicella-zoster virus from a client who had an outbreak of varicella-zoster on her scalp. This patient, my patient it is actually has had no physical contact with the other patient. 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 Please and cells actually does not appear to be very consistent with herpetic ones but appear more consistent with molluscum contagiosum.  He also had requests for prescription for Marinol. He did not need this medicine for appetite stimulation but rather was asking for it to help him  His mood. He end to 3 times a week but then running out every third day and claiming that he became very irritable and was one of the Marinol to help stabilize his moods when he is not on marijuana.    Review of Systems  Constitutional: Negative for fever, chills, diaphoresis, activity change, appetite change, fatigue and unexpected weight change.  HENT: Negative for congestion, sore throat, rhinorrhea, sneezing, trouble swallowing and sinus pressure.   Eyes: Negative for photophobia and visual disturbance.  Respiratory: Negative for cough, chest tightness, shortness of breath, wheezing and stridor.   Cardiovascular: Negative for chest pain, palpitations and leg swelling.  Gastrointestinal: Negative for nausea, vomiting, abdominal pain, diarrhea, constipation, blood in stool, abdominal distention and anal bleeding.  Genitourinary:  Negative for dysuria, hematuria, flank pain and difficulty urinating.  Musculoskeletal: Negative for myalgias, back pain, joint swelling, arthralgias and gait problem.  Skin: Negative for color change, pallor, rash and wound.  Neurological: Negative for dizziness, tremors, weakness and light-headedness.  Hematological: Negative for adenopathy. Does not bruise/bleed easily.  Psychiatric/Behavioral: Negative for behavioral problems, confusion, sleep disturbance, dysphoric mood, decreased concentration and agitation.       Objective:   Physical Exam  Constitutional: He is oriented to person, place, and time. He appears well-developed and well-nourished. No distress.  HENT:  Head: Normocephalic and atraumatic.  Mouth/Throat: Oropharynx is clear and moist. No oropharyngeal exudate.  Eyes: Conjunctivae normal and EOM are normal. Pupils are equal, round, and reactive to light. No scleral icterus.  Neck: Normal range of motion. Neck supple. No JVD present.  Cardiovascular: Normal rate, regular rhythm and normal heart sounds.  Exam reveals no gallop and no friction rub.   No murmur heard. Pulmonary/Chest: Effort normal and breath sounds normal. No respiratory distress. He has no wheezes. He has no rales. He exhibits no tenderness.  Abdominal: He exhibits no distension and no mass. There is no tenderness. There is no rebound and no guarding.  Musculoskeletal: He exhibits no edema and no tenderness.  Lymphadenopathy:    He has no cervical adenopathy.  Neurological: He is alert and oriented to person, place, and time. He exhibits normal muscle tone. Coordination normal.  Skin: Skin is warm and dry. He is not diaphoretic. No erythema. No pallor.     Psychiatric: He has a normal mood and affect. His behavior is normal. Judgment  and thought content normal.          Assessment & Plan:  HIV: continue atripla  Marijuana use: I cannot rx marinol to alleviate his days without this  drug  MOlluscum: should resolve in next month or two. 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

## 2012-06-24 ENCOUNTER — Other Ambulatory Visit: Payer: Self-pay | Admitting: *Deleted

## 2012-06-24 DIAGNOSIS — B20 Human immunodeficiency virus [HIV] disease: Secondary | ICD-10-CM

## 2012-06-24 MED ORDER — EFAVIRENZ-EMTRICITAB-TENOFOVIR 600-200-300 MG PO TABS: 1.0000 | ORAL_TABLET | Freq: Every day | ORAL | Status: DC

## 2012-06-25 ENCOUNTER — Telehealth: Payer: Self-pay | Admitting: Licensed Clinical Social Worker

## 2012-06-25 NOTE — Telephone Encounter (Signed)
Patient received a flu and pneumonia vaccine on Wednesday during clinic, and called today stating that he had cold symptoms that started yesterday, no fever or cough. He also stated that his left arm that I administered the flu shot was red and painful. I advised him to take ibuprofen, and use a warm compress on his arm, he stated that it was getting better than yesterday. I advised him to call our office if it worsens or persist.

## 2012-07-21 ENCOUNTER — Ambulatory Visit: Payer: Self-pay

## 2012-10-15 ENCOUNTER — Encounter: Payer: Self-pay | Admitting: *Deleted

## 2012-11-02 ENCOUNTER — Other Ambulatory Visit: Payer: Self-pay | Admitting: *Deleted

## 2012-11-02 DIAGNOSIS — B20 Human immunodeficiency virus [HIV] disease: Secondary | ICD-10-CM

## 2012-11-02 MED ORDER — EFAVIRENZ-EMTRICITAB-TENOFOVIR 600-200-300 MG PO TABS: 1.0000 | ORAL_TABLET | Freq: Every day | ORAL | Status: DC

## 2012-12-30 ENCOUNTER — Encounter: Payer: Self-pay | Admitting: Infectious Disease

## 2012-12-30 ENCOUNTER — Other Ambulatory Visit: Payer: Self-pay | Admitting: *Deleted

## 2012-12-30 DIAGNOSIS — B20 Human immunodeficiency virus [HIV] disease: Secondary | ICD-10-CM

## 2012-12-30 MED ORDER — EFAVIRENZ-EMTRICITAB-TENOFOVIR 600-200-300 MG PO TABS: 1.0000 | ORAL_TABLET | Freq: Every day | ORAL | Status: DC

## 2013-01-05 ENCOUNTER — Other Ambulatory Visit: Payer: Self-pay | Admitting: Licensed Clinical Social Worker

## 2013-01-05 DIAGNOSIS — B20 Human immunodeficiency virus [HIV] disease: Secondary | ICD-10-CM

## 2013-01-05 MED ORDER — EFAVIRENZ-EMTRICITAB-TENOFOVIR 600-200-300 MG PO TABS: 1.0000 | ORAL_TABLET | Freq: Every day | ORAL | Status: DC

## 2013-01-18 ENCOUNTER — Other Ambulatory Visit: Payer: Self-pay | Admitting: Infectious Disease

## 2013-02-02 ENCOUNTER — Telehealth: Payer: Self-pay

## 2013-02-02 NOTE — Telephone Encounter (Signed)
Patient returned call regarding adap/rw recertification - he has [facility] - is emailing a copy of his card today

## 2013-02-02 NOTE — Telephone Encounter (Signed)
Called patient and left message that it was time to recertify for adap/rw - he is one that usually comes in on his own w/o being prompted - had not heard from him.

## 2013-02-04 ENCOUNTER — Ambulatory Visit: Payer: BC Managed Care – PPO

## 2013-04-21 ENCOUNTER — Other Ambulatory Visit: Payer: Self-pay

## 2013-05-23 ENCOUNTER — Telehealth: Payer: Self-pay | Admitting: *Deleted

## 2013-05-23 ENCOUNTER — Other Ambulatory Visit: Payer: Self-pay | Admitting: Infectious Disease

## 2013-05-23 DIAGNOSIS — B20 Human immunodeficiency virus [HIV] disease: Secondary | ICD-10-CM

## 2013-05-23 NOTE — Telephone Encounter (Signed)
I do not want him to go without meds but he needs to keep his appts. Is he rescheduled for an appt now

## 2013-05-23 NOTE — Telephone Encounter (Signed)
Yes, your first available in January -- Monday, 1/5 (he says work won't let him take a day off until the new year).  He will have enough medication to get through 1/8.

## 2013-05-23 NOTE — Telephone Encounter (Signed)
Patient hasn't been seen since 06/23/12, but has not missed any medication.  Pt called stating that he was out refills, wouldn't be able to come in until January 2015.  RN gave him 1 month refills, scheduled him for visit with Dr. Daiva Eves on 06/20/13.  Pt will need to do labs on the same day, as he cannot come in for labwork in December and RN will not authorize 2-3 months of medication refills without him being seen by the physician.  RN emphasized the need for the patient to keep his upcoming appointment for further refills.  Patient verbalized understanding, accepted the appointment 06/20/13 9:00. Andree Coss, RN

## 2013-06-20 ENCOUNTER — Ambulatory Visit: Payer: BC Managed Care – PPO | Admitting: Infectious Disease

## 2013-06-21 ENCOUNTER — Telehealth: Payer: Self-pay | Admitting: *Deleted

## 2013-06-21 ENCOUNTER — Other Ambulatory Visit: Payer: Self-pay | Admitting: Infectious Disease

## 2013-06-21 DIAGNOSIS — B20 Human immunodeficiency virus [HIV] disease: Secondary | ICD-10-CM

## 2013-06-21 NOTE — Telephone Encounter (Signed)
Pt made new MD appt fpr 06/27/13.  He has enough medication to get him through appt.

## 2013-06-21 NOTE — Telephone Encounter (Signed)
Patient has not been seen in 1 year.  Requested refill in December, 2014,  RN gave 1 month supply with the patient understanding that he must keep his upcoming appointment to get additional refills.  Patient no-showed that appointment.

## 2013-06-27 ENCOUNTER — Ambulatory Visit (INDEPENDENT_AMBULATORY_CARE_PROVIDER_SITE_OTHER): Payer: BC Managed Care – PPO | Admitting: Infectious Disease

## 2013-06-27 ENCOUNTER — Encounter: Payer: Self-pay | Admitting: Infectious Disease

## 2013-06-27 VITALS — BP 138/90 | HR 103 | Temp 97.7°F | Wt 159.0 lb

## 2013-06-27 DIAGNOSIS — F411 Generalized anxiety disorder: Secondary | ICD-10-CM

## 2013-06-27 DIAGNOSIS — F32A Depression, unspecified: Secondary | ICD-10-CM

## 2013-06-27 DIAGNOSIS — F3289 Other specified depressive episodes: Secondary | ICD-10-CM

## 2013-06-27 DIAGNOSIS — Z23 Encounter for immunization: Secondary | ICD-10-CM

## 2013-06-27 DIAGNOSIS — F17200 Nicotine dependence, unspecified, uncomplicated: Secondary | ICD-10-CM

## 2013-06-27 DIAGNOSIS — F329 Major depressive disorder, single episode, unspecified: Secondary | ICD-10-CM | POA: Insufficient documentation

## 2013-06-27 DIAGNOSIS — B20 Human immunodeficiency virus [HIV] disease: Secondary | ICD-10-CM

## 2013-06-27 DIAGNOSIS — Z113 Encounter for screening for infections with a predominantly sexual mode of transmission: Secondary | ICD-10-CM

## 2013-06-27 MED ORDER — CLONAZEPAM 0.5 MG PO TABS: 0.5000 mg | ORAL_TABLET | Freq: Every evening | ORAL | Status: DC | PRN

## 2013-06-27 MED ORDER — BUPROPION HCL ER (XL) 150 MG PO TB24: 150.0000 mg | ORAL_TABLET | Freq: Every day | ORAL | Status: DC

## 2013-06-27 NOTE — Addendum Note (Signed)
Addended by: Mariea ClontsGREEN, Lane Kjos D on: 06/27/2013 10:06 AM   Modules accepted: Orders

## 2013-06-27 NOTE — Progress Notes (Signed)
Subjective:    Patient ID: Lawrence Bowen, male    DOB: 1980/12/21, 33 y.o.   MRN: [id number]  HPI   Lawrence Bowen is a 33 y.o. male who  Had been superbly well on his  antiviral regimen, atripla with undetectable viral load and healthy cd4 count  He has had ZERO blood work since Nov 2013 and was last seen > year ago in January of 2014  He continues to have problems with irritability at work esp if he does not smoke marijuana. He has started smoking tobacco again and has gained weight.      . Review of Systems  Constitutional: Negative for fever, chills, diaphoresis, activity change, appetite change, fatigue and unexpected weight change.  HENT: Negative for congestion, rhinorrhea, sinus pressure, sneezing, sore throat and trouble swallowing.   Eyes: Negative for photophobia and visual disturbance.  Respiratory: Negative for cough, chest tightness, shortness of breath, wheezing and stridor.   Cardiovascular: Negative for chest pain, palpitations and leg swelling.  Gastrointestinal: Negative for nausea, vomiting, abdominal pain, diarrhea, constipation, blood in stool, abdominal distention and anal bleeding.  Genitourinary: Negative for dysuria, hematuria, flank pain and difficulty urinating.  Musculoskeletal: Negative for arthralgias, back pain, gait problem, joint swelling and myalgias.  Skin: Negative for color change, pallor, rash and wound.  Neurological: Negative for dizziness, tremors, weakness and light-headedness.  Hematological: Negative for adenopathy. Does not bruise/bleed easily.  Psychiatric/Behavioral: Positive for dysphoric mood. Negative for suicidal ideas, behavioral problems, confusion, sleep disturbance, self-injury, decreased concentration and agitation. The patient is hyperactive.        Objective:   Physical Exam  Constitutional: He is oriented to person, place, and time. He appears well-developed and well-nourished. No distress.  HENT:  Head: Normocephalic  and atraumatic.  Mouth/Throat: Oropharynx is clear and moist. No oropharyngeal exudate.  Eyes: Conjunctivae and EOM are normal. Pupils are equal, round, and reactive to light. No scleral icterus.  Neck: Normal range of motion. Neck supple. No JVD present.  Cardiovascular: Normal rate, regular rhythm and normal heart sounds.  Exam reveals no gallop and no friction rub.   No murmur heard. Pulmonary/Chest: Effort normal and breath sounds normal. No respiratory distress. He has no wheezes. He has no rales. He exhibits no tenderness.  Abdominal: He exhibits no distension and no mass. There is no tenderness. There is no rebound and no guarding.  Musculoskeletal: He exhibits no edema and no tenderness.  Lymphadenopathy:    He has no cervical adenopathy.  Neurological: He is alert and oriented to person, place, and time. He exhibits normal muscle tone. Coordination normal.  Skin: Skin is warm and dry. He is not diaphoretic. No erythema. No pallor.     Psychiatric: He has a normal mood and affect. His behavior is normal. Judgment and thought content normal.          Assessment & Plan:  HIV: I have encouraged switch to different STR, complera or STRIBILD and he will consider. He has smoking and Dad with early CAD making ABC based regimen NOT desirable basedon DAD data. Check labs today and he will consider I spent greater than 25 minutes with the patient including greater than 50% of time in face to face counsel of the patient and in coordination of their care.   Marijuana use: I dont have problem with this but could consider that some of his mood instability is due to being on aned off this drug  Irritability, depressive ssx: would like him OFF  atripla if he is willing to make change. Start wellbutrin 150mg  xl and klonopin 0./5mg  qhs  SMoking: wellbutrin. I spent greater than 3 minutes in smoking cessation counselling  Need for flu vaccine: flu vax given

## 2013-07-01 LAB — HLA B*5701: HLA-B*5701 w/rflx HLA-B High: NEGATIVE

## 2013-07-14 ENCOUNTER — Other Ambulatory Visit: Payer: Self-pay | Admitting: Licensed Clinical Social Worker

## 2013-07-14 DIAGNOSIS — B20 Human immunodeficiency virus [HIV] disease: Secondary | ICD-10-CM

## 2013-07-14 MED ORDER — EFAVIRENZ-EMTRICITAB-TENOFOVIR 600-200-300 MG PO TABS: 1.0000 | ORAL_TABLET | Freq: Every day | ORAL | Status: DC

## 2013-07-21 ENCOUNTER — Other Ambulatory Visit: Payer: Self-pay | Admitting: Infectious Disease

## 2013-07-22 ENCOUNTER — Other Ambulatory Visit: Payer: Self-pay | Admitting: *Deleted

## 2013-07-22 DIAGNOSIS — B20 Human immunodeficiency virus [HIV] disease: Secondary | ICD-10-CM

## 2013-07-22 MED ORDER — EFAVIRENZ-EMTRICITAB-TENOFOVIR 600-200-300 MG PO TABS: 1.0000 | ORAL_TABLET | Freq: Every day | ORAL | Status: DC

## 2013-07-25 ENCOUNTER — Other Ambulatory Visit: Payer: Self-pay | Admitting: Licensed Clinical Social Worker

## 2013-07-25 DIAGNOSIS — B20 Human immunodeficiency virus [HIV] disease: Secondary | ICD-10-CM

## 2013-07-25 MED ORDER — EFAVIRENZ-EMTRICITAB-TENOFOVIR 600-200-300 MG PO TABS: 1.0000 | ORAL_TABLET | Freq: Every day | ORAL | Status: DC

## 2013-08-09 ENCOUNTER — Telehealth: Payer: Self-pay | Admitting: *Deleted

## 2013-08-09 NOTE — Telephone Encounter (Signed)
Fine to re-authorize the klonopin for 4 refills

## 2013-08-09 NOTE — Telephone Encounter (Signed)
Pt called to let Dr. Daiva EvesVan Dam know that pt has decided to stop the welbutrin, but WILL TAKD the klonopin 0.5 mg QHS.  He would like to know if refills will be authorized.  Please advise how many refills are authorized. Andree CossHowell, Tristan Proto M, RN

## 2013-08-10 ENCOUNTER — Other Ambulatory Visit: Payer: Self-pay | Admitting: *Deleted

## 2013-08-10 DIAGNOSIS — B20 Human immunodeficiency virus [HIV] disease: Secondary | ICD-10-CM

## 2013-08-10 MED ORDER — CLONAZEPAM 0.5 MG PO TABS: 0.5000 mg | ORAL_TABLET | Freq: Every evening | ORAL | Status: DC | PRN

## 2013-08-10 NOTE — Telephone Encounter (Signed)
Authorized refills to pharmacy of patient's choice.  Thanks!

## 2013-08-31 ENCOUNTER — Other Ambulatory Visit: Payer: Self-pay | Admitting: Licensed Clinical Social Worker

## 2013-08-31 DIAGNOSIS — B20 Human immunodeficiency virus [HIV] disease: Secondary | ICD-10-CM

## 2013-08-31 MED ORDER — CLONAZEPAM 0.5 MG PO TABS: 0.5000 mg | ORAL_TABLET | Freq: Every evening | ORAL | Status: DC | PRN

## 2013-09-26 ENCOUNTER — Other Ambulatory Visit: Payer: BC Managed Care – PPO

## 2013-09-26 DIAGNOSIS — Z79899 Other long term (current) drug therapy: Secondary | ICD-10-CM

## 2013-09-26 DIAGNOSIS — B20 Human immunodeficiency virus [HIV] disease: Secondary | ICD-10-CM

## 2013-09-26 LAB — CBC WITH DIFFERENTIAL/PLATELET
Basophils Absolute: 0 10*3/uL (ref 0.0–0.1)
Basophils Relative: 0 % (ref 0–1)
Eosinophils Absolute: 0.2 10*3/uL (ref 0.0–0.7)
Eosinophils Relative: 3 % (ref 0–5)
HCT: 42.1 % (ref 39.0–52.0)
HEMOGLOBIN: 15 g/dL (ref 13.0–17.0)
LYMPHS ABS: 1.4 10*3/uL (ref 0.7–4.0)
Lymphocytes Relative: 27 % (ref 12–46)
MCH: 30.8 pg (ref 26.0–34.0)
MCHC: 35.6 g/dL (ref 30.0–36.0)
MCV: 86.4 fL (ref 78.0–100.0)
MONOS PCT: 10 % (ref 3–12)
Monocytes Absolute: 0.5 10*3/uL (ref 0.1–1.0)
Neutro Abs: 3.1 10*3/uL (ref 1.7–7.7)
Neutrophils Relative %: 60 % (ref 43–77)
Platelets: 210 10*3/uL (ref 150–400)
RBC: 4.87 MIL/uL (ref 4.22–5.81)
RDW: 13.3 % (ref 11.5–15.5)
WBC: 5.2 10*3/uL (ref 4.0–10.5)

## 2013-09-26 LAB — LIPID PANEL
CHOL/HDL RATIO: 3.6 ratio
Cholesterol: 167 mg/dL (ref 0–200)
HDL: 46 mg/dL (ref >39)
LDL Cholesterol: 100 mg/dL — ABNORMAL HIGH (ref 0–99)
TRIGLYCERIDES: 106 mg/dL (ref <150)
VLDL: 21 mg/dL (ref 0–40)

## 2013-09-26 LAB — COMPLETE METABOLIC PANEL WITH GFR
ALT: 10 U/L (ref 0–53)
AST: 17 U/L (ref 0–37)
Albumin: 4.5 g/dL (ref 3.5–5.2)
Alkaline Phosphatase: 61 U/L (ref 39–117)
BILIRUBIN TOTAL: 0.3 mg/dL (ref 0.2–1.2)
BUN: 10 mg/dL (ref 6–23)
CALCIUM: 9.3 mg/dL (ref 8.4–10.5)
CHLORIDE: 105 meq/L (ref 96–112)
CO2: 27 mEq/L (ref 19–32)
CREATININE: 1.08 mg/dL (ref 0.50–1.35)
GFR, Est African American: >89 mL/min
GFR, Est Non African American: >89 mL/min
Glucose, Bld: 93 mg/dL (ref 70–99)
Potassium: 4.1 mEq/L (ref 3.5–5.3)
Sodium: 139 mEq/L (ref 135–145)
Total Protein: 7 g/dL (ref 6.0–8.3)

## 2013-09-26 LAB — RPR

## 2013-09-27 LAB — T-HELPER CELL (CD4) - (RCID CLINIC ONLY)
CD4 % Helper T Cell: 32 % — ABNORMAL LOW (ref 33–55)
CD4 T Cell Abs: 470 /uL (ref 400–2700)

## 2013-09-28 LAB — HIV-1 RNA QUANT-NO REFLEX-BLD
HIV 1 RNA Quant: <20 copies/mL (ref <20)
HIV-1 RNA Quant, Log: <1.3 {Log} (ref <1.30)

## 2013-10-12 ENCOUNTER — Encounter: Payer: Self-pay | Admitting: Infectious Disease

## 2013-10-12 ENCOUNTER — Ambulatory Visit (INDEPENDENT_AMBULATORY_CARE_PROVIDER_SITE_OTHER): Payer: BC Managed Care – PPO | Admitting: Infectious Disease

## 2013-10-12 VITALS — BP 150/91 | HR 97 | Temp 98.7°F | Ht 67.0 in | Wt 152.0 lb

## 2013-10-12 DIAGNOSIS — B20 Human immunodeficiency virus [HIV] disease: Secondary | ICD-10-CM

## 2013-10-12 DIAGNOSIS — F341 Dysthymic disorder: Secondary | ICD-10-CM

## 2013-10-12 DIAGNOSIS — F418 Other specified anxiety disorders: Secondary | ICD-10-CM

## 2013-10-12 DIAGNOSIS — F17200 Nicotine dependence, unspecified, uncomplicated: Secondary | ICD-10-CM

## 2013-10-12 MED ORDER — ABACAVIR-DOLUTEGRAVIR-LAMIVUD 600-50-300 MG PO TABS: 1.0000 | ORAL_TABLET | Freq: Every day | ORAL | Status: DC

## 2013-10-12 NOTE — Progress Notes (Signed)
Subjective:    Patient ID: Lawrence Bowen, male    DOB: July 30, 1980, 33 y.o.   MRN: [id number]  HPI   Lawrence Bowen is a 33 y.o. male who  Had been superbly well on his  antiviral regimen, atripla with undetectable viral load and healthy cd4 count.   He continues to suffer from irritability both at work and at home where he has very noisy neighbors.   He is taking klonopin which helps him stay calm esp at work.  He is not taking the antidepressant that I rx him.  We discussed changing him off of EFV based regimen. He also expressed interest in changing to [facility] for [facility] care--which is closer to [place] in [place]   He previously had been reluctant to go to that clinic because he had stated that he had been in relations with a former provider from the ID clinic who has since moved out of state.     . Review of Systems  Constitutional: Negative for fever, chills, diaphoresis, activity change, appetite change, fatigue and unexpected weight change.  HENT: Negative for congestion, rhinorrhea, sinus pressure, sneezing, sore throat and trouble swallowing.   Eyes: Negative for photophobia and visual disturbance.  Respiratory: Negative for cough, chest tightness, shortness of breath, wheezing and stridor.   Cardiovascular: Negative for chest pain, palpitations and leg swelling.  Gastrointestinal: Negative for nausea, vomiting, abdominal pain, diarrhea, constipation, blood in stool, abdominal distention and anal bleeding.  Genitourinary: Negative for dysuria, hematuria, flank pain and difficulty urinating.  Musculoskeletal: Negative for arthralgias, back pain, gait problem, joint swelling and myalgias.  Skin: Negative for color change, pallor, rash and wound.  Neurological: Negative for dizziness, tremors, weakness and light-headedness.  Hematological: Negative for adenopathy. Does not bruise/bleed easily.  Psychiatric/Behavioral: Positive for dysphoric mood. Negative for suicidal ideas,  behavioral problems, confusion, sleep disturbance, self-injury, decreased concentration and agitation. The patient is hyperactive.        Objective:   Physical Exam  Constitutional: He is oriented to person, place, and time. He appears well-developed and well-nourished. No distress.  HENT:  Head: Normocephalic and atraumatic.  Mouth/Throat: Oropharynx is clear and moist. No oropharyngeal exudate.  Eyes: Conjunctivae and EOM are normal. Pupils are equal, round, and reactive to light. No scleral icterus.  Neck: Normal range of motion. Neck supple. No JVD present.  Cardiovascular: Normal rate, regular rhythm and normal heart sounds.  Exam reveals no gallop and no friction rub.   No murmur heard. Pulmonary/Chest: Effort normal and breath sounds normal. No respiratory distress. He has no wheezes. He has no rales. He exhibits no tenderness.  Abdominal: He exhibits no distension and no mass. There is no tenderness. There is no rebound and no guarding.  Musculoskeletal: He exhibits no edema and no tenderness.  Lymphadenopathy:    He has no cervical adenopathy.  Neurological: He is alert and oriented to person, place, and time. He exhibits normal muscle tone. Coordination normal.  Skin: Skin is warm and dry. He is not diaphoretic. No erythema. No pallor.     Psychiatric: Judgment and thought content normal. His mood appears anxious. He is agitated.          Assessment & Plan:  [facility]: [facility] . He accepts that there still is debate re risk of ABC with re to CV disease.  i do have some concern re his active smoking here. I spent greater than 25 minutes with the patient including greater than 50%  of time in face to face counsel of the patient and in coordination of their care.   Irritability, depressive ssx: would like him OFF atripla if he is willing to make change. Continue the klonopin 0./5mg  qhs. He should get back on an antidepressant   Marijuana use: I dont have  problem with this but could consider that some of his mood instability is due to being on aned off this drug  SMoking: need to tackle again

## 2013-10-31 ENCOUNTER — Telehealth: Payer: Self-pay | Admitting: *Deleted

## 2013-10-31 DIAGNOSIS — B20 Human immunodeficiency virus [HIV] disease: Secondary | ICD-10-CM

## 2013-10-31 MED ORDER — EFAVIRENZ-EMTRICITAB-TENOFOVIR 600-200-300 MG PO TABS: 1.0000 | ORAL_TABLET | Freq: Every day | ORAL | Status: DC

## 2013-10-31 NOTE — Addendum Note (Signed)
Addended by: Starleen ArmsYARBOROUGH, Darus Hershman D on: 10/31/2013 10:40 AM   Modules accepted: Orders, Medications

## 2013-10-31 NOTE — Telephone Encounter (Signed)
I don't know if he got the copay card for TRIUMEQ--I doubt it. Can we look into this quickly and if he still has that much copay with TRIUMEQ we can certainly switch him back for time being to Atripla. I dont want him to miss any pills

## 2013-10-31 NOTE — Telephone Encounter (Signed)
Received the following email: Hey Dr Algis LimingVandam. You recently switched me to Triumec from Atripla. Well its gonna cost $520 a month for this Triumec and that is not possible. I want you to refil my Atripla which costs $45 a month. I only have 12 pills left so I need you to please do this as soon as possible. Please   Left message with patient (had to leave call back number).  Need to get in touch with Pam for patient assistance, ask if he has a copay card for the Triumeq.  Pt was switched from Atripla to Triumeq d/t side effects at last visit.   Will cc Dr. Daiva EvesVan Dam. Andree CossMichelle M Howell, RN

## 2013-10-31 NOTE — Telephone Encounter (Signed)
Patient wants to go back on Atripla even with the co pay card discussion. I will change it in the computer and call in refills.

## 2013-10-31 NOTE — Telephone Encounter (Signed)
That is fine 

## 2013-11-09 ENCOUNTER — Other Ambulatory Visit: Payer: BC Managed Care – PPO

## 2013-11-16 ENCOUNTER — Ambulatory Visit: Payer: BC Managed Care – PPO | Admitting: Infectious Disease

## 2013-12-27 ENCOUNTER — Telehealth: Payer: Self-pay | Admitting: *Deleted

## 2013-12-27 NOTE — Telephone Encounter (Signed)
He had [facility] to [facility]. I am ok to refill until he transitions care to them (if that is what he is still doing)

## 2013-12-27 NOTE — Telephone Encounter (Signed)
Patient called stating he just picked up his last refill on Klonopin and wanted future refills called in for next month. Noticed he did not have a follow up appointment and had missed lab. He said that was because he was going to be changed to Triumeq, but it is not covered under his insurance. Please advise on refills and when patient should schedule follow up lab and appt.

## 2013-12-28 ENCOUNTER — Other Ambulatory Visit: Payer: Self-pay | Admitting: *Deleted

## 2013-12-28 DIAGNOSIS — B20 Human immunodeficiency virus [HIV] disease: Secondary | ICD-10-CM

## 2013-12-28 MED ORDER — CLONAZEPAM 0.5 MG PO TABS: 0.5000 mg | ORAL_TABLET | Freq: Every evening | ORAL | Status: DC | PRN

## 2013-12-28 NOTE — Telephone Encounter (Signed)
Patient states he is unsure at this time if he is going to transfer. Scheduled f/u appt and labs in October. And [facility].

## 2014-02-23 ENCOUNTER — Telehealth: Payer: Self-pay | Admitting: *Deleted

## 2014-02-23 NOTE — Telephone Encounter (Signed)
Patient called requesting a copay card for Atripla. Mailed to his home address. Wendall Mola

## 2014-03-20 ENCOUNTER — Other Ambulatory Visit: Payer: BC Managed Care – PPO

## 2014-04-03 ENCOUNTER — Ambulatory Visit: Payer: BC Managed Care – PPO | Admitting: Infectious Disease

## 2014-04-14 ENCOUNTER — Ambulatory Visit: Payer: BC Managed Care – PPO

## 2014-04-14 ENCOUNTER — Other Ambulatory Visit: Payer: BC Managed Care – PPO

## 2014-04-14 DIAGNOSIS — B20 Human immunodeficiency virus [HIV] disease: Secondary | ICD-10-CM

## 2014-04-14 LAB — T-HELPER CELL (CD4) - (RCID CLINIC ONLY)
CD4 T CELL HELPER: 35 % (ref 33–55)
CD4 T Cell Abs: 430 /uL (ref 400–2700)

## 2014-04-15 LAB — COMPLETE METABOLIC PANEL WITH GFR
ALBUMIN: 5.1 g/dL (ref 3.5–5.2)
ALT: 13 U/L (ref 0–53)
AST: 18 U/L (ref 0–37)
Alkaline Phosphatase: 71 U/L (ref 39–117)
BUN: 11 mg/dL (ref 6–23)
CALCIUM: 9.2 mg/dL (ref 8.4–10.5)
CHLORIDE: 102 meq/L (ref 96–112)
CO2: 25 mEq/L (ref 19–32)
Creat: 0.91 mg/dL (ref 0.50–1.35)
GFR, Est African American: >89 mL/min
GFR, Est Non African American: >89 mL/min
Glucose, Bld: 92 mg/dL (ref 70–99)
POTASSIUM: 4 meq/L (ref 3.5–5.3)
SODIUM: 140 meq/L (ref 135–145)
TOTAL PROTEIN: 7.5 g/dL (ref 6.0–8.3)
Total Bilirubin: 0.4 mg/dL (ref 0.2–1.2)

## 2014-04-15 LAB — CBC WITH DIFFERENTIAL/PLATELET
Basophils Absolute: 0 10*3/uL (ref 0.0–0.1)
Basophils Relative: 0 % (ref 0–1)
EOS ABS: 0.1 10*3/uL (ref 0.0–0.7)
Eosinophils Relative: 3 % (ref 0–5)
HEMATOCRIT: 47 % (ref 39.0–52.0)
HEMOGLOBIN: 16.4 g/dL (ref 13.0–17.0)
LYMPHS ABS: 1.1 10*3/uL (ref 0.7–4.0)
Lymphocytes Relative: 22 % (ref 12–46)
MCH: 31.3 pg (ref 26.0–34.0)
MCHC: 34.9 g/dL (ref 30.0–36.0)
MCV: 89.7 fL (ref 78.0–100.0)
MONOS PCT: 10 % (ref 3–12)
Monocytes Absolute: 0.5 10*3/uL (ref 0.1–1.0)
NEUTROS ABS: 3.1 10*3/uL (ref 1.7–7.7)
Neutrophils Relative %: 65 % (ref 43–77)
Platelets: 215 10*3/uL (ref 150–400)
RBC: 5.24 MIL/uL (ref 4.22–5.81)
RDW: 13.8 % (ref 11.5–15.5)
WBC: 4.8 10*3/uL (ref 4.0–10.5)

## 2014-04-17 LAB — HIV-1 RNA QUANT-NO REFLEX-BLD
HIV 1 RNA Quant: <20 copies/mL (ref <20)
HIV-1 RNA Quant, Log: <1.3 {Log} (ref <1.30)

## 2014-04-19 ENCOUNTER — Encounter: Payer: Self-pay | Admitting: Infectious Disease

## 2014-04-21 ENCOUNTER — Other Ambulatory Visit: Payer: Self-pay | Admitting: *Deleted

## 2014-04-21 DIAGNOSIS — B20 Human immunodeficiency virus [HIV] disease: Secondary | ICD-10-CM

## 2014-04-21 MED ORDER — EFAVIRENZ-EMTRICITAB-TENOFOVIR 600-200-300 MG PO TABS: 1.0000 | ORAL_TABLET | Freq: Every day | ORAL | Status: DC

## 2014-05-08 ENCOUNTER — Other Ambulatory Visit: Payer: BC Managed Care – PPO

## 2014-05-16 ENCOUNTER — Other Ambulatory Visit: Payer: Self-pay | Admitting: Licensed Clinical Social Worker

## 2014-05-16 DIAGNOSIS — B20 Human immunodeficiency virus [HIV] disease: Secondary | ICD-10-CM

## 2014-05-16 MED ORDER — EFAVIRENZ-EMTRICITAB-TENOFOVIR 600-200-300 MG PO TABS: 1.0000 | ORAL_TABLET | Freq: Every day | ORAL | Status: DC

## 2014-05-22 ENCOUNTER — Ambulatory Visit (INDEPENDENT_AMBULATORY_CARE_PROVIDER_SITE_OTHER): Payer: BC Managed Care – PPO | Admitting: Infectious Disease

## 2014-05-22 ENCOUNTER — Ambulatory Visit (INDEPENDENT_AMBULATORY_CARE_PROVIDER_SITE_OTHER): Payer: Self-pay | Admitting: Licensed Clinical Social Worker

## 2014-05-22 ENCOUNTER — Encounter: Payer: Self-pay | Admitting: Infectious Disease

## 2014-05-22 VITALS — BP 132/95 | HR 100 | Temp 98.0°F | Wt 152.0 lb

## 2014-05-22 DIAGNOSIS — F32A Depression, unspecified: Secondary | ICD-10-CM

## 2014-05-22 DIAGNOSIS — F329 Major depressive disorder, single episode, unspecified: Secondary | ICD-10-CM

## 2014-05-22 DIAGNOSIS — F411 Generalized anxiety disorder: Secondary | ICD-10-CM

## 2014-05-22 DIAGNOSIS — B20 Human immunodeficiency virus [HIV] disease: Secondary | ICD-10-CM

## 2014-05-22 DIAGNOSIS — Z720 Tobacco use: Secondary | ICD-10-CM

## 2014-05-22 DIAGNOSIS — Z23 Encounter for immunization: Secondary | ICD-10-CM

## 2014-05-22 DIAGNOSIS — F17200 Nicotine dependence, unspecified, uncomplicated: Secondary | ICD-10-CM

## 2014-05-22 NOTE — Progress Notes (Signed)
  Subjective:    Patient ID: Lawrence Bowen Bango, male    DOB: December 17, 1980, 33 y.o.   MRN: [id number]  HPI   Lawrence Bowen Tung is a 33 y.o. male who  Had been superbly well on his  antiviral regimen, atripla with undetectable viral load and healthy cd4 count.   Lab Results  Component Value Date   HIV1RNAQUANT <20 04/14/2014   Lab Results  Component Value Date   CD4TABS 430 04/14/2014   CD4TABS 470 09/26/2013   CD4TABS 440 04/30/2012   We had [facility] to [facility] but insurance would not cover.  He is now back on ADAP because without insurance.  He has stopped taking klonopin but may want to take it in future. Isuggested he get psychiatrist to rx his anxiety and depression.     . Review of Systems  Constitutional: Negative for fever, chills, diaphoresis, activity change, appetite change, fatigue and unexpected weight change.  HENT: Negative for congestion, rhinorrhea, sinus pressure, sneezing, sore throat and trouble swallowing.   Eyes: Negative for photophobia and visual disturbance.  Respiratory: Negative for cough, chest tightness, shortness of breath, wheezing and stridor.   Cardiovascular: Negative for chest pain, palpitations and leg swelling.  Gastrointestinal: Negative for nausea, vomiting, abdominal pain, diarrhea, constipation, blood in stool, abdominal distention and anal bleeding.  Genitourinary: Negative for dysuria, hematuria, flank pain and difficulty urinating.  Musculoskeletal: Negative for myalgias, back pain, joint swelling, arthralgias and gait problem.  Skin: Negative for color change, pallor, rash and wound.  Neurological: Negative for dizziness, tremors, weakness and light-headedness.  Hematological: Negative for adenopathy. Does not bruise/bleed easily.  Psychiatric/Behavioral: Positive for dysphoric mood. Negative for suicidal ideas, behavioral problems, confusion, sleep disturbance, self-injury, decreased concentration and agitation. The patient is  hyperactive.        Objective:   Physical Exam  Constitutional: He is oriented to person, place, and time. He appears well-developed and well-nourished. No distress.  HENT:  Head: Normocephalic and atraumatic.  Mouth/Throat: Oropharynx is clear and moist. No oropharyngeal exudate.  Eyes: Conjunctivae and EOM are normal. Pupils are equal, round, and reactive to light. No scleral icterus.  Neck: Normal range of motion. Neck supple. No JVD present.  Cardiovascular: Normal rate, regular rhythm and normal heart sounds.  Exam reveals no gallop and no friction rub.   No murmur heard. Pulmonary/Chest: Effort normal and breath sounds normal. No respiratory distress. He has no wheezes. He has no rales. He exhibits no tenderness.  Abdominal: He exhibits no distension and no mass. There is no tenderness. There is no rebound and no guarding.  Musculoskeletal: He exhibits no edema or tenderness.  Lymphadenopathy:    He has no cervical adenopathy.  Neurological: He is alert and oriented to person, place, and time. He exhibits normal muscle tone. Coordination normal.  Skin: Skin is warm and dry. He is not diaphoretic. No erythema. No pallor.  Psychiatric: He has a normal mood and affect. His behavior is normal. Judgment and thought content normal.          Assessment & Plan:  HIV: continue Atripla and revisit a different STR such as TAF regimen or TRIUMEQ in July.    Depression: continue to try to manage off klonopin. I spent greater than 25 minutes with the patient including greater than 50% of time in face to face counsel of the patient and in coordination of their care.   SMoking: need to tackle again

## 2014-07-03 ENCOUNTER — Ambulatory Visit: Payer: Self-pay

## 2014-07-05 ENCOUNTER — Ambulatory Visit: Payer: Self-pay

## 2014-07-05 ENCOUNTER — Other Ambulatory Visit: Payer: Self-pay | Admitting: *Deleted

## 2014-07-05 DIAGNOSIS — B20 Human immunodeficiency virus [HIV] disease: Secondary | ICD-10-CM

## 2014-07-05 MED ORDER — EFAVIRENZ-EMTRICITAB-TENOFOVIR 600-200-300 MG PO TABS: 1.0000 | ORAL_TABLET | Freq: Every day | ORAL | Status: DC

## 2014-07-05 NOTE — Telephone Encounter (Signed)
Pt returned call, made appt for next week w/ Dr. Ninetta LightsHatcher.

## 2015-01-13 ENCOUNTER — Other Ambulatory Visit: Payer: Self-pay | Admitting: Infectious Disease

## 2015-01-16 ENCOUNTER — Ambulatory Visit: Payer: Self-pay

## 2015-02-13 ENCOUNTER — Other Ambulatory Visit: Payer: Self-pay | Admitting: Infectious Disease

## 2015-02-14 ENCOUNTER — Ambulatory Visit (INDEPENDENT_AMBULATORY_CARE_PROVIDER_SITE_OTHER): Payer: Self-pay | Admitting: Infectious Disease

## 2015-02-14 ENCOUNTER — Ambulatory Visit: Payer: Self-pay | Admitting: *Deleted

## 2015-02-14 ENCOUNTER — Encounter: Payer: Self-pay | Admitting: Infectious Disease

## 2015-02-14 VITALS — BP 160/84 | HR 98 | Temp 98.1°F | Wt 136.0 lb

## 2015-02-14 DIAGNOSIS — R451 Restlessness and agitation: Secondary | ICD-10-CM

## 2015-02-14 DIAGNOSIS — F339 Major depressive disorder, recurrent, unspecified: Secondary | ICD-10-CM

## 2015-02-14 DIAGNOSIS — F1290 Cannabis use, unspecified, uncomplicated: Secondary | ICD-10-CM

## 2015-02-14 DIAGNOSIS — F330 Major depressive disorder, recurrent, mild: Secondary | ICD-10-CM

## 2015-02-14 DIAGNOSIS — Z23 Encounter for immunization: Secondary | ICD-10-CM

## 2015-02-14 DIAGNOSIS — F1291 Cannabis use, unspecified, in remission: Secondary | ICD-10-CM

## 2015-02-14 DIAGNOSIS — R634 Abnormal weight loss: Secondary | ICD-10-CM

## 2015-02-14 DIAGNOSIS — Z87898 Personal history of other specified conditions: Secondary | ICD-10-CM

## 2015-02-14 DIAGNOSIS — I10 Essential (primary) hypertension: Secondary | ICD-10-CM

## 2015-02-14 DIAGNOSIS — F411 Generalized anxiety disorder: Secondary | ICD-10-CM

## 2015-02-14 DIAGNOSIS — B20 Human immunodeficiency virus [HIV] disease: Secondary | ICD-10-CM

## 2015-02-14 HISTORY — DX: Cannabis use, unspecified, in remission: F12.91

## 2015-02-14 HISTORY — DX: Essential (primary) hypertension: I10

## 2015-02-14 HISTORY — DX: Major depressive disorder, recurrent, unspecified: F33.9

## 2015-02-14 HISTORY — DX: Personal history of other specified conditions: Z87.898

## 2015-02-14 HISTORY — DX: Cannabis use, unspecified, uncomplicated: F12.90

## 2015-02-14 MED ORDER — ABACAVIR-DOLUTEGRAVIR-LAMIVUD 600-50-300 MG PO TABS: 1.0000 | ORAL_TABLET | Freq: Every day | ORAL | Status: DC

## 2015-02-14 MED ORDER — ESCITALOPRAM OXALATE 10 MG PO TABS: 10.0000 mg | ORAL_TABLET | Freq: Every day | ORAL | Status: DC

## 2015-02-14 NOTE — Progress Notes (Signed)
Subjective:    Patient ID: Lawrence Bowen, male    DOB: 08/10/1980, 34 y.o.   MRN: [id number]  HPI   Lawrence Bowen is a 34 y.o. male who has in the past been superbly well on his  antiviral regimen, atripla with undetectable viral load and healthy cd4 count.   He is now not been seen for 10 months but even taking his medications regularly has anxiety over the last month he may miss 3-4 doses.  He has an increasingly distressing symptoms of feeling agitated and depressed and not getting along with his friends and Acupuncturist.  He attributes some of this to stopping smoking marijuana more than a month ago. He is also spot stop smoking tobacco having been helped to do so by having taken some Paxil that he says that he "found on the side of the road".    Lab Results  Component Value Date   HIV1RNAQUANT <20 04/14/2014   Lab Results  Component Value Date   CD4TABS 430 04/14/2014   CD4TABS 470 09/26/2013   CD4TABS 440 04/30/2012     . Review of Systems  Constitutional: Positive for appetite change and unexpected weight change. Negative for fever, diaphoresis, activity change and fatigue.  HENT: Negative for congestion, rhinorrhea, sinus pressure, sneezing, sore throat and trouble swallowing.   Eyes: Negative for photophobia and visual disturbance.  Respiratory: Negative for cough, chest tightness, shortness of breath, wheezing and stridor.   Cardiovascular: Negative for chest pain, palpitations and leg swelling.  Gastrointestinal: Negative for nausea, vomiting, abdominal pain, diarrhea, constipation, blood in stool, abdominal distention and anal bleeding.  Genitourinary: Negative for dysuria, hematuria, flank pain and difficulty urinating.  Musculoskeletal: Negative for myalgias, back pain, joint swelling, arthralgias and gait problem.  Skin: Negative for color change, pallor, rash and wound.  Neurological: Negative for dizziness, tremors, weakness and light-headedness.   Hematological: Negative for adenopathy. Does not bruise/bleed easily.  Psychiatric/Behavioral: Positive for dysphoric mood. Negative for suicidal ideas, behavioral problems, confusion, sleep disturbance, self-injury, decreased concentration and agitation. The patient is nervous/anxious and is hyperactive.        Objective:   Physical Exam  Constitutional: He is oriented to person, place, and time. He appears well-developed and well-nourished. No distress.  HENT:  Head: Normocephalic and atraumatic.  Mouth/Throat: Oropharynx is clear and moist. No oropharyngeal exudate.  Eyes: Conjunctivae and EOM are normal. Pupils are equal, round, and reactive to light. No scleral icterus.  Neck: Normal range of motion. Neck supple. No JVD present.  Cardiovascular: Normal rate, regular rhythm and normal heart sounds.  Exam reveals no gallop and no friction rub.   No murmur heard. Pulmonary/Chest: Effort normal and breath sounds normal. No respiratory distress. He has no wheezes. He has no rales. He exhibits no tenderness.  Abdominal: He exhibits no distension and no mass. There is no tenderness. There is no rebound and no guarding.  Musculoskeletal: He exhibits no edema or tenderness.  Lymphadenopathy:    He has no cervical adenopathy.  Neurological: He is alert and oriented to person, place, and time. He exhibits normal muscle tone. Coordination normal.  Skin: Skin is warm and dry. He is not diaphoretic. No erythema. No pallor.  Psychiatric: His behavior is normal. Judgment and thought content normal. His mood appears anxious. His speech is rapid and/or pressured.          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 Will have low threshold to start anti-Hypetensive Marijuana and tobacco use: Currently has stopped both  I spent greater than 40 minutes with the patient including greater than 50% of time in face to face counsel of the patient during his HIV and his new ARV regimen, his HTN his depression with reduced appetite and need for counseling and initiation of SSRI and in coordination of their care.

## 2015-02-14 NOTE — BH Specialist Note (Signed)
Counselor met with client today as a warm handoff in the exam room per Dr. Tommy Medal recommendation.  Client was oriented times four with good affect and dress. Client was alert and talkative.  Client had no problem communicating what was going on in his life and was most honest and open.  Client mentioned that his mother was on dialysis and not doing well, his father died not to long ago, has health issues, and stopped smoking cannabis 3 weeks ago.  Client says with all this going on he has become very agitated and easily angered.  Counselor encouraged client to seek counseling to monitor agitation and develop better coping skills.  Client said he would check his work schedule and call back to set up an appointment with counselor.  Client shared that he found some Prozac on the ground and took four pills of it to see if it would help his mood.  Client reported that the Prozac medication helped him.  Dr. Lucianne Lei dam indicated that the HIV medication that client was presently taking could add to the moodiness.  Dr. Tommy Medal suggested canging the medication to something else that would hopefully not affect his mood as negatively.  Client agreed to meet and update on how things have gone over the next week or two providing time for the new medication to start working and dissolve the side affects from original prescribed HIV medication.   Rolena Infante, LPCA, MA [facility]

## 2015-02-14 NOTE — Patient Instructions (Signed)
I am changing you to [facility] today which you can take with or without food daily  We will check blood work today and in one months time after you have been on TRIUMEQ  I am also rx lexapro which I would start 1-2 weeks after switch to TRIUMEQ from ATRIPLA  I would start the lexapro at half tablet daily for a week and then go to full tablet  We want to see you again to review repeat labs in about 6 weeks time

## 2015-03-02 NOTE — Progress Notes (Signed)
Notified [facility] via fax.  Howell, Michelle M, RN  

## 2015-03-05 NOTE — Progress Notes (Signed)
Faxed approval notice to [facility]. Howell, Michelle M, RN 

## 2015-03-28 ENCOUNTER — Ambulatory Visit: Payer: Self-pay | Admitting: Infectious Disease

## 2015-07-25 ENCOUNTER — Ambulatory Visit: Payer: Self-pay

## 2015-07-26 ENCOUNTER — Ambulatory Visit: Payer: Self-pay

## 2015-07-31 ENCOUNTER — Other Ambulatory Visit (INDEPENDENT_AMBULATORY_CARE_PROVIDER_SITE_OTHER): Payer: Self-pay

## 2015-07-31 DIAGNOSIS — Z113 Encounter for screening for infections with a predominantly sexual mode of transmission: Secondary | ICD-10-CM

## 2015-07-31 DIAGNOSIS — B20 Human immunodeficiency virus [HIV] disease: Secondary | ICD-10-CM

## 2015-07-31 LAB — COMPLETE METABOLIC PANEL WITH GFR
ALBUMIN: 4.3 g/dL (ref 3.6–5.1)
ALK PHOS: 54 U/L (ref 40–115)
ALT: 15 U/L (ref 9–46)
AST: 19 U/L (ref 10–40)
BILIRUBIN TOTAL: 0.4 mg/dL (ref 0.2–1.2)
BUN: 11 mg/dL (ref 7–25)
CO2: 26 mmol/L (ref 20–31)
CREATININE: 1.14 mg/dL (ref 0.60–1.35)
Calcium: 9.3 mg/dL (ref 8.6–10.3)
Chloride: 103 mmol/L (ref 98–110)
GFR, EST NON AFRICAN AMERICAN: 83 mL/min (ref >=60)
GFR, Est African American: >89 mL/min (ref >=60)
GLUCOSE: 99 mg/dL (ref 65–99)
Potassium: 4 mmol/L (ref 3.5–5.3)
SODIUM: 139 mmol/L (ref 135–146)
TOTAL PROTEIN: 7 g/dL (ref 6.1–8.1)

## 2015-07-31 LAB — CBC WITH DIFFERENTIAL/PLATELET
BASOS ABS: 0 10*3/uL (ref 0.0–0.1)
BASOS PCT: 0 % (ref 0–1)
EOS ABS: 0.1 10*3/uL (ref 0.0–0.7)
Eosinophils Relative: 3 % (ref 0–5)
HCT: 45.9 % (ref 39.0–52.0)
HEMOGLOBIN: 15.6 g/dL (ref 13.0–17.0)
Lymphocytes Relative: 31 % (ref 12–46)
Lymphs Abs: 1.5 10*3/uL (ref 0.7–4.0)
MCH: 31.4 pg (ref 26.0–34.0)
MCHC: 34 g/dL (ref 30.0–36.0)
MCV: 92.4 fL (ref 78.0–100.0)
MPV: 9.8 fL (ref 8.6–12.4)
Monocytes Absolute: 0.4 10*3/uL (ref 0.1–1.0)
Monocytes Relative: 9 % (ref 3–12)
NEUTROS ABS: 2.7 10*3/uL (ref 1.7–7.7)
NEUTROS PCT: 57 % (ref 43–77)
PLATELETS: 205 10*3/uL (ref 150–400)
RBC: 4.97 MIL/uL (ref 4.22–5.81)
RDW: 14.1 % (ref 11.5–15.5)
WBC: 4.7 10*3/uL (ref 4.0–10.5)

## 2015-08-01 LAB — HIV-1 RNA QUANT-NO REFLEX-BLD
HIV 1 RNA Quant: <20 copies/mL (ref <20)
HIV-1 RNA Quant, Log: <1.3 Log copies/mL (ref <1.30)

## 2015-08-01 LAB — URINE CYTOLOGY ANCILLARY ONLY
CHLAMYDIA, DNA PROBE: NEGATIVE
Neisseria Gonorrhea: NEGATIVE

## 2015-08-01 LAB — T-HELPER CELL (CD4) - (RCID CLINIC ONLY)
CD4 % Helper T Cell: 39 % (ref 33–55)
CD4 T CELL ABS: 530 /uL (ref 400–2700)

## 2015-08-01 LAB — MICROALBUMIN / CREATININE URINE RATIO
CREATININE, URINE: 97 mg/dL (ref 20–370)
MICROALB UR: 0.2 mg/dL
MICROALB/CREAT RATIO: 2 ug/mg{creat} (ref <30)

## 2015-08-01 LAB — RPR

## 2015-08-20 ENCOUNTER — Telehealth: Payer: Self-pay | Admitting: *Deleted

## 2015-08-20 NOTE — Telephone Encounter (Signed)
Patient called upset about his medications and care. He is upset that he is "poor and HIV positive so I have NO options." He states that he felt "like I was dying" the other day and went the hospital where his blood pressure was 140/100.  He states that the nurse asked him if he was on BP meds, he stated he was prescribed them, but hadn't taken any.  He stated he would go home and take one.  Patient then read the side effects of the lexapro (which he assumed was for blood pressure) and saw that elevated blood pressure and heart palpitations were listed.  He states he was incredibly upset and wondered why 1) he wasn't given blood pressure medication and 2) why the physician would give him something that would make his hypertension worse. RN attempted to explain that, per his notes, Dr. Daiva EvesVan Dam did not prescribe HTN meds, that there was a low threshold for him to do so, but he thought the patient was experiencing more white-coat hypertension.  Patient was prescribed the lexapro, as he stated he tried "pills he found on the ground" to help with his symptoms.  Patient denied he ever did this and was upset that "anyone would try to prescribe me something that would make my already-bad blood pressure worse." RN continued to talk to the patient to make sure that he felt he was heard.  Patient would like to change providers (is already scheduled with Dr. Drue SecondSnider).  He was transferred to Dennison Mascotave Jenkins to make sure that his feelings about the issue were heard completely.  He ended the call satisfied and calm. Andree CossHowell, Xiamara Hulet M, RN

## 2015-08-21 NOTE — Telephone Encounter (Signed)
This is Klippel's personality somewhat. [facility].

## 2015-09-11 ENCOUNTER — Encounter: Payer: Self-pay | Admitting: Internal Medicine

## 2015-09-11 ENCOUNTER — Ambulatory Visit (INDEPENDENT_AMBULATORY_CARE_PROVIDER_SITE_OTHER): Payer: Self-pay | Admitting: Internal Medicine

## 2015-09-11 VITALS — BP 125/81 | HR 91 | Temp 98.3°F | Wt 168.0 lb

## 2015-09-11 DIAGNOSIS — I10 Essential (primary) hypertension: Secondary | ICD-10-CM

## 2015-09-11 DIAGNOSIS — R451 Restlessness and agitation: Secondary | ICD-10-CM

## 2015-09-11 DIAGNOSIS — B20 Human immunodeficiency virus [HIV] disease: Secondary | ICD-10-CM

## 2015-09-11 NOTE — Progress Notes (Signed)
Patient ID: Lawrence Bowen, male   DOB: Nov 29, 1980, 35 y.o.   MRN: [id number]       Patient ID: Lawrence Bowen, male   DOB: 1981/02/01, 35 y.o.   MRN: [id number]  HPI 35yo M with HIV disease, CD 4 count of 530/VL<20 (feb 2017), on triumeq. Previously well controlled on atripla was changed off of the regimen in August due to concern for worsening depression. At that time, Dr. Daiva Eves changed him to triumeq in addition to starting SSRI with lexapro. The patient reports not taking lexapro due to not feeling depressed, though he does state he easily gets frustrated, angry, displeased with his work and feels stress.  He states near end of 8 hr shift at [employer] he feels lightheaded due to not having breaks, eating/drinking  Outpatient Encounter Prescriptions as of 09/11/2015  Medication Sig  . Abacavir-Dolutegravir-Lamivud (TRIUMEQ) 600-50-300 MG TABS Take 1 tablet by mouth daily.  Marland Kitchen escitalopram (LEXAPRO) 10 MG tablet Take 1 tablet (10 mg total) by mouth daily. (Patient not taking: Reported on 09/11/2015)   No facility-administered encounter medications on file as of 09/11/2015.     Patient Active Problem List   Diagnosis Date Noted  . Major depression, recurrent (HCC) 02/14/2015  . 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 HTN (hypertension) 02/14/2015  . Depression 06/27/2013  . Generalized anxiety disorder 06/27/2013  . 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 Molluscum contagiosum 06/23/2012  . SWELLING MASS OR LUMP IN HEAD AND NECK 11/27/2008  . DECREASED LIBIDO 11/27/2008  . SKIN RASH, ALLERGIC 05/07/2007  . WEIGHT LOSS 05/07/2007  . 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 MALE PATTERN BALDNESS 04/01/2007  . SYMPTOM, COUGH 04/01/2007  . Human immunodeficiency virus (HIV) disease (HCC) 03/11/2007     There are no preventive care reminders to display for this patient.   Review of Systems 10 point ros is negative except what is mentioned in  hpi Physical Exam   BP 125/81 mmHg  Pulse 91  Temp(Src) 98.3 F (36.8 C) (Oral)  Wt 168 lb (76.204 kg) Physical Exam  Constitutional: He is oriented to person, place, and time. He appears well-developed and well-nourished. No distress.  HENT:  Mouth/Throat: Oropharynx is clear and moist. No oropharyngeal exudate.  Cardiovascular: Normal rate, regular rhythm and normal heart sounds. Exam reveals no gallop and no friction rub.  No murmur heard.  Pulmonary/Chest: Effort normal and breath sounds normal. No respiratory distress. He has no wheezes.  Abdominal: Soft. Bowel sounds are normal. He exhibits no distension. There is no tenderness.  Lymphadenopathy:  He has no cervical adenopathy.  Neurological: He is alert and oriented to person, place, and time.  Skin: Skin is warm and dry. No rash noted. No erythema.  Psychiatric: He has a normal mood and affect. His behavior is normal.    Lab Results  Component Value Date   CD4TCELL 39 07/31/2015   Lab Results  Component Value Date   CD4TABS 530 07/31/2015   CD4TABS 430 04/14/2014   CD4TABS 470 09/26/2013   Lab Results  Component Value Date   HIV1RNAQUANT <20 07/31/2015   Lab Results  Component Value Date   HEPBSAB NEG 03/11/2007   No results found for: RPR  CBC Lab Results  Component Value Date   WBC 4.7 07/31/2015   RBC 4.97 07/31/2015   HGB 15.6 07/31/2015   HCT 45.9 07/31/2015   PLT 205 07/31/2015   MCV 92.4 07/31/2015   MCH  31.4 07/31/2015   MCHC 34.0 07/31/2015   RDW 14.1 07/31/2015   LYMPHSABS 1.5 07/31/2015   MONOABS 0.4 07/31/2015   EOSABS 0.1 07/31/2015   BASOSABS 0.0 07/31/2015   BMET Lab Results  Component Value Date   NA 139 07/31/2015   K 4.0 07/31/2015   CL 103 07/31/2015   CO2 26 07/31/2015   GLUCOSE 99 07/31/2015   BUN 11 07/31/2015   CREATININE 1.14 07/31/2015   CALCIUM 9.3 07/31/2015   GFRNONAA 83 07/31/2015   GFRAA >89 07/31/2015     Assessment and Plan  hiv disease = well  controlled on triumeq. Recommend to continue on this regimen  Depression = patient denies any current depression signs. Nor does he subscribe to anxiety, but does feel "Stress" . Did not take lexapro. Does have quick temper, displeased iwht work, frustrated at times with stress of work. He is open to [facility] with [facility] for [facility]  Pre htn = resolved due to diet modification  Work letter to have him change from 8 hr shifts to 6 hr shifts to improve mood/decrease stress

## 2015-10-03 ENCOUNTER — Ambulatory Visit: Payer: Self-pay | Admitting: Infectious Disease

## 2016-01-16 ENCOUNTER — Ambulatory Visit: Payer: Self-pay

## 2016-01-16 ENCOUNTER — Encounter: Payer: Self-pay | Admitting: Internal Medicine

## 2016-01-16 ENCOUNTER — Ambulatory Visit (INDEPENDENT_AMBULATORY_CARE_PROVIDER_SITE_OTHER): Payer: Self-pay | Admitting: Internal Medicine

## 2016-01-16 DIAGNOSIS — B20 Human immunodeficiency virus [HIV] disease: Secondary | ICD-10-CM

## 2016-01-16 DIAGNOSIS — I10 Essential (primary) hypertension: Secondary | ICD-10-CM

## 2016-01-16 DIAGNOSIS — R635 Abnormal weight gain: Secondary | ICD-10-CM

## 2016-01-16 NOTE — Assessment & Plan Note (Signed)
Patient is concerned about his weight gain.  His average weight ranges between 150-160 pounds.  However, 02/14/15 he weighed 136 pounds (patient reports he was actively trying to lose weight last summer).  He does report reduction in his physical activity and increased sweets/sugars in his diet.  Discussed physical activity options and diet modifications to allow steady weight loss (1 pound per week).

## 2016-01-16 NOTE — Progress Notes (Signed)
   Subjective:    Patient ID: Lawrence Bowen, male    DOB: [birth year], 35 y.o.   MRN: [id number]  HPI [name] is a 35 year old male diagnosed with HIV.  Initially, he was treated with Atripla, but was switched to Triumeq of ART modernization August 2016.  Patient reports tolerating Triumeq well and denies any missed doses in the past 4 weeks.  He is seen in clinic for routine follow-up with concerns regarding his weight gain.  Lab Results  Component Value Date   CD4TABS 530 07/31/2015   CD4TABS 430 04/14/2014   CD4TABS 470 09/26/2013   Lab Results  Component Value Date   HIV1RNAQUANT <20 07/31/2015   Review of Systems  Constitutional: Negative for appetite change, chills and fever.  HENT: Negative for mouth sores and trouble swallowing.   Respiratory: Negative for chest tightness and shortness of breath.   Cardiovascular: Negative for chest pain and leg swelling.  Gastrointestinal: Negative for abdominal pain, constipation, diarrhea, nausea and vomiting.  Genitourinary: Negative for discharge and dysuria.  Skin: Negative for rash.  Neurological: Negative for dizziness and headaches.      Objective:   Physical Exam  Constitutional: He is oriented to person, place, and time. He appears well-developed and well-nourished.  HENT:  Mouth/Throat: Oropharynx is clear and moist. No oropharyngeal exudate.  Eyes: Pupils are equal, round, and reactive to light.  Cardiovascular: Normal rate, regular rhythm and normal heart sounds.   No murmur heard. Pulmonary/Chest: Effort normal and breath sounds normal. No respiratory distress. He has no wheezes.  Abdominal: Soft. Bowel sounds are normal. He exhibits no distension. There is no tenderness.  Lymphadenopathy:    He has no cervical adenopathy.  Neurological: He is alert and oriented to person, place, and time.  Skin: Skin is warm and dry. No rash noted.  Psychiatric: He has a normal mood and affect. His behavior is normal.  Judgment and thought content normal.      Assessment & Plan:

## 2016-01-16 NOTE — Assessment & Plan Note (Signed)
Patient remains adherent to his ART regimen.  Additionally, he remains under excellent control (CD4 530, VL<20 as of 07/31/15).  He denies any missed doses or side effects related to medication.  He is concerned that Triumeq may be the cause of his weight gain; however, he prefers to remain on same regimen.  Denies any unprotected sex or new partners.  Consider STD testing at next visit as patient expresses need to leave clinic soon.  Patient to follow-up in 6 months with labs prior.

## 2016-01-16 NOTE — Assessment & Plan Note (Addendum)
Blood pressure within normal range for two consecutive visits (range 124-125/79-81).  Denies chest pain, headache, dizziness, or changes in vision.  Will continue to monitor.

## 2016-02-06 ENCOUNTER — Other Ambulatory Visit: Payer: Self-pay | Admitting: Infectious Disease

## 2016-02-06 DIAGNOSIS — B20 Human immunodeficiency virus [HIV] disease: Secondary | ICD-10-CM

## 2016-02-07 ENCOUNTER — Encounter: Payer: Self-pay | Admitting: Infectious Disease

## 2016-07-29 ENCOUNTER — Ambulatory Visit: Payer: Self-pay

## 2016-07-31 ENCOUNTER — Encounter: Payer: Self-pay | Admitting: Infectious Disease

## 2016-08-15 ENCOUNTER — Other Ambulatory Visit: Payer: Self-pay | Admitting: Internal Medicine

## 2016-08-15 DIAGNOSIS — B20 Human immunodeficiency virus [HIV] disease: Secondary | ICD-10-CM

## 2017-01-28 ENCOUNTER — Other Ambulatory Visit: Payer: Self-pay | Admitting: Internal Medicine

## 2017-01-28 DIAGNOSIS — B20 Human immunodeficiency virus [HIV] disease: Secondary | ICD-10-CM

## 2017-02-02 ENCOUNTER — Ambulatory Visit: Payer: Self-pay

## 2017-02-04 ENCOUNTER — Encounter: Payer: Self-pay | Admitting: Infectious Disease

## 2017-02-09 ENCOUNTER — Other Ambulatory Visit: Payer: Self-pay | Admitting: *Deleted

## 2017-02-09 DIAGNOSIS — B20 Human immunodeficiency virus [HIV] disease: Secondary | ICD-10-CM

## 2017-02-09 MED ORDER — ABACAVIR-DOLUTEGRAVIR-LAMIVUD 600-50-300 MG PO TABS: 1.0000 | ORAL_TABLET | Freq: Every day | ORAL | 2 refills | Status: DC

## 2017-02-18 ENCOUNTER — Other Ambulatory Visit: Payer: Self-pay

## 2017-02-18 DIAGNOSIS — B20 Human immunodeficiency virus [HIV] disease: Secondary | ICD-10-CM

## 2017-02-18 MED ORDER — ABACAVIR-DOLUTEGRAVIR-LAMIVUD 600-50-300 MG PO TABS: 1.0000 | ORAL_TABLET | Freq: Every day | ORAL | 1 refills | Status: DC

## 2017-02-18 NOTE — Telephone Encounter (Signed)
Patient left the following  irate message on voice mail:   "You did not refill my medication as promised. The pharmacy has been calling and you have not returned their call.  I am almost out of medication.  I think you are not refilling my medication on purpose because you want me to run out.  What the hell is wrong with you that you cant call in my medication.  How many times do I have to call to get my dam medicine.  Someone better send my  medication to the pharmacy right now. "  Medication was sent to the pharmacy.  I will call the patient to make sure it was the correct pharmacy.

## 2017-03-16 ENCOUNTER — Encounter: Payer: Self-pay | Admitting: Internal Medicine

## 2017-03-16 ENCOUNTER — Ambulatory Visit (INDEPENDENT_AMBULATORY_CARE_PROVIDER_SITE_OTHER): Payer: Self-pay | Admitting: Licensed Clinical Social Worker

## 2017-03-16 ENCOUNTER — Ambulatory Visit (INDEPENDENT_AMBULATORY_CARE_PROVIDER_SITE_OTHER): Payer: Self-pay | Admitting: Internal Medicine

## 2017-03-16 VITALS — BP 136/77 | HR 87 | Temp 98.1°F | Ht 68.5 in | Wt 150.0 lb

## 2017-03-16 DIAGNOSIS — F1220 Cannabis dependence, uncomplicated: Secondary | ICD-10-CM

## 2017-03-16 DIAGNOSIS — B20 Human immunodeficiency virus [HIV] disease: Secondary | ICD-10-CM

## 2017-03-16 DIAGNOSIS — Z23 Encounter for immunization: Secondary | ICD-10-CM

## 2017-03-16 MED ORDER — ABACAVIR-DOLUTEGRAVIR-LAMIVUD 600-50-300 MG PO TABS: 1.0000 | ORAL_TABLET | Freq: Every day | ORAL | 11 refills | Status: DC

## 2017-03-16 MED ORDER — FLUOXETINE HCL 20 MG PO CAPS: 20.0000 mg | ORAL_CAPSULE | Freq: Every day | ORAL | 5 refills | Status: DC

## 2017-03-16 NOTE — Progress Notes (Signed)
RFV: hiv disease follow up  Patient ID: Lawrence Bowen, male   DOB: 04/15/1981, 36 y.o.   MRN: [id number]  HPI Lawrence Bowen is a 36yo M with hiv disease, last seen in august 2017, currently on triumeq. He easily gets frustrated at work, angry often. Self medicates with marijuana.  He has a lot of changes in his life, including his partner now working 3rd shift, not speaking with his mother. Working relationship difficulties  Outpatient Encounter Prescriptions as of 03/16/2017  Medication Sig  . abacavir-dolutegravir-lamiVUDine (TRIUMEQ) 600-50-300 MG tablet Take 1 tablet by mouth daily.  Marland Kitchen escitalopram (LEXAPRO) 10 MG tablet Take 1 tablet (10 mg total) by mouth daily. (Patient not taking: Reported on 01/16/2016)   No facility-administered encounter medications on file as of 03/16/2017.      Patient Active Problem List   Diagnosis Date Noted  . Weight gain 01/16/2016  . Major depression, recurrent (HCC) 02/14/2015  . 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 HTN (hypertension) 02/14/2015  . Depression 06/27/2013  . Generalized anxiety disorder 06/27/2013  . 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 Molluscum contagiosum 06/23/2012  . SWELLING MASS OR LUMP IN HEAD AND NECK 11/27/2008  . DECREASED LIBIDO 11/27/2008  . SKIN RASH, ALLERGIC 05/07/2007  . WEIGHT LOSS 05/07/2007  . 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 MALE PATTERN BALDNESS 04/01/2007  . SYMPTOM, COUGH 04/01/2007  . Human immunodeficiency virus (HIV) disease (HCC) 03/11/2007     Health Maintenance Due  Topic Date Due  . INFLUENZA VACCINE  01/14/2017  . TETANUS/TDAP  02/28/2017     Review of Systems Review of Systems  Constitutional: Negative for fever, chills, diaphoresis, activity change, appetite change, fatigue and unexpected weight change.  HENT: Negative for congestion, sore throat, rhinorrhea, sneezing, trouble swallowing and sinus pressure.  Eyes: Negative for photophobia and visual  disturbance.  Respiratory: Negative for cough, chest tightness, shortness of breath, wheezing and stridor.  Cardiovascular: Negative for chest pain, palpitations and leg swelling.  Gastrointestinal: Negative for nausea, vomiting, abdominal pain, diarrhea, constipation, blood in stool, abdominal distention and anal bleeding.  Genitourinary: Negative for dysuria, hematuria, flank pain and difficulty urinating.  Musculoskeletal: Negative for myalgias, back pain, joint swelling, arthralgias and gait problem.  Skin: Negative for color change, pallor, rash and wound.  Neurological: Negative for dizziness, tremors, weakness and light-headedness.  Hematological: Negative for adenopathy. Does not bruise/bleed easily.  Psychiatric/Behavioral: frustration with work   Physical Exam  BP 136/77   Pulse 87   Temp 98.1 F (36.7 C) (Oral)   Ht 5' 8.5" (1.74 m)   Wt 150 lb (68 kg)   BMI 22.48 kg/m  Physical Exam  Constitutional: He is oriented to person, place, and time. He appears well-developed and well-nourished. No distress.  HENT:  Mouth/Throat: Oropharynx is clear and moist. No oropharyngeal exudate.  Cardiovascular: Normal rate, regular rhythm and normal heart sounds. Exam reveals no gallop and no friction rub.  No murmur heard.  Pulmonary/Chest: Effort normal and breath sounds normal. No respiratory distress. He has no wheezes.  Abdominal: Soft. Bowel sounds are normal. He exhibits no distension. There is no tenderness.  Lymphadenopathy:  He has no cervical adenopathy.  Neurological: He is alert and oriented to person, place, and time.  Skin: Skin is warm and dry. No rash noted. No erythema.  Psychiatric: He has a normal mood and affect. His behavior is normal.     Lab Results  Component Value Date   CD4TCELL 49  07/31/2015   Lab Results  Component Value Date   CD4TABS 530 07/31/2015   CD4TABS 430 04/14/2014   CD4TABS 470 09/26/2013   Lab Results  Component Value Date    HIV1RNAQUANT <20 07/31/2015   Lab Results  Component Value Date   HEPBSAB NEG 03/11/2007   Lab Results  Component Value Date   LABRPR NON REAC 07/31/2015    CBC Lab Results  Component Value Date   WBC 4.7 07/31/2015   RBC 4.97 07/31/2015   HGB 15.6 07/31/2015   HCT 45.9 07/31/2015   PLT 205 07/31/2015   MCV 92.4 07/31/2015   MCH 31.4 07/31/2015   MCHC 34.0 07/31/2015   RDW 14.1 07/31/2015   LYMPHSABS 1.5 07/31/2015   MONOABS 0.4 07/31/2015   EOSABS 0.1 07/31/2015    BMET Lab Results  Component Value Date   NA 139 07/31/2015   K 4.0 07/31/2015   CL 103 07/31/2015   CO2 26 07/31/2015   GLUCOSE 99 07/31/2015   BUN 11 07/31/2015   CREATININE 1.14 07/31/2015   CALCIUM 9.3 07/31/2015   GFRNONAA 83 07/31/2015   GFRAA >89 07/31/2015    Sochx: smokes marijuana. Significant other is np at [employer]  Assessment and Plan  Anger/depression = Wants to try prozac which we will initiate as well as have him start with counseling in clinic  hiv disease = Refill triumeq. Will do labs  rtc in 3 months  Spent 45 min with patient discussing methods of coping with anger  Flu, tetanus, and then will do meningitis in 3 months

## 2017-03-17 LAB — COMPLETE METABOLIC PANEL WITH GFR
AG Ratio: 2.1 (calc) (ref 1.0–2.5)
ALBUMIN MSPROF: 5 g/dL (ref 3.6–5.1)
ALT: 16 U/L (ref 9–46)
AST: 20 U/L (ref 10–40)
Alkaline phosphatase (APISO): 58 U/L (ref 40–115)
BILIRUBIN TOTAL: 0.3 mg/dL (ref 0.2–1.2)
BUN: 12 mg/dL (ref 7–25)
CALCIUM: 9 mg/dL (ref 8.6–10.3)
CHLORIDE: 105 mmol/L (ref 98–110)
CO2: 28 mmol/L (ref 20–32)
CREATININE: 1.09 mg/dL (ref 0.60–1.35)
GFR, EST AFRICAN AMERICAN: 101 mL/min/{1.73_m2} (ref >=60)
GFR, EST NON AFRICAN AMERICAN: 87 mL/min/{1.73_m2} (ref >=60)
GLUCOSE: 91 mg/dL (ref 65–99)
Globulin: 2.4 g/dL (calc) (ref 1.9–3.7)
Potassium: 3.9 mmol/L (ref 3.5–5.3)
Sodium: 139 mmol/L (ref 135–146)
TOTAL PROTEIN: 7.4 g/dL (ref 6.1–8.1)

## 2017-03-17 LAB — LIPID PANEL
CHOL/HDL RATIO: 3.9 (calc) (ref <5.0)
Cholesterol: 165 mg/dL (ref <200)
HDL: 42 mg/dL (ref >40)
LDL CHOLESTEROL (CALC): 99 mg/dL
NON-HDL CHOLESTEROL (CALC): 123 mg/dL (ref <130)
Triglycerides: 139 mg/dL (ref <150)

## 2017-03-17 LAB — RPR: RPR Ser Ql: NONREACTIVE

## 2017-03-17 LAB — CBC WITH DIFFERENTIAL/PLATELET
BASOS PCT: 0.3 %
Basophils Absolute: 22 cells/uL (ref 0–200)
EOS ABS: 117 {cells}/uL (ref 15–500)
Eosinophils Relative: 1.6 %
HCT: 43.8 % (ref 38.5–50.0)
HEMOGLOBIN: 15.3 g/dL (ref 13.2–17.1)
Lymphs Abs: 1759 cells/uL (ref 850–3900)
MCH: 32.8 pg (ref 27.0–33.0)
MCHC: 34.9 g/dL (ref 32.0–36.0)
MCV: 93.8 fL (ref 80.0–100.0)
MONOS PCT: 7.5 %
MPV: 9.9 fL (ref 7.5–12.5)
Neutro Abs: 4855 cells/uL (ref 1500–7800)
Neutrophils Relative %: 66.5 %
Platelets: 218 10*3/uL (ref 140–400)
RBC: 4.67 10*6/uL (ref 4.20–5.80)
RDW: 12.7 % (ref 11.0–15.0)
Total Lymphocyte: 24.1 %
WBC: 7.3 10*3/uL (ref 3.8–10.8)
WBCMIX: 548 {cells}/uL (ref 200–950)

## 2017-03-17 NOTE — BH Specialist Note (Signed)
Integrated Behavioral Health Initial Visit  MRN: [id number] Name: Lawrence Bowen  Number of Integrated Behavioral Health Clinician visits:: 1/6 Session Start time: 4:16 pm  Session End time: 4:44 pm Total time: 30 minutes  Type of Service: Integrated Behavioral Health- Individual/Family Interpretor:No. Interpretor Name and Language: N/A   Warm Hand Off Completed.       SUBJECTIVE: Lawrence Bowen is a 36 y.o. male accompanied by self Patient was referred by Dr. Drue Second for "anger management issues".  Patient reports the following symptoms/concerns: Patient reported feeling "aggrevated" and irritable, which he believes is the result of a medication change.  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  While agitated, patient described the following physical sensations: increased heart rate, face gets red, and getting "the shakes".  Cognitively patient also reported decreased frustration tolerance, an increase in impulsive thinking and violent thoughts towards his customers.  Patient reported that he has been able to control his impulsive, violent thinking and not act on his impulses.  Patient also described his current work environment where he feels misunderstood by management and where the customers are demanding.  Among patients current stressors include his brothers death three years ago, dysfunction in the family due to guardianship fights of his deceased brother's daughter where his mother is not speaking to him, and his partner working third shift and having little interaction.  Additionally the patient reported polysubstance use.  Patient reported first smoking Marijuana at 14, smoking $20/day all day, with last use while he was driving here for the appointment.  Patient reported that he can go without using but becomes very "grouchy".  Patient also reported heavy drinking of [place] straight as a coping mechanism, with last use 1 year ago.  Patient  was receptive to need for additional assessment and referral to substance use treatment in [place].  Patient was receptive to scheduling appointment with [name] for additional assessment.  By the time the patient was ready to leave the office he denied still being under the influence of Marijuana, denied any impairment to operate a motor vehicle, denied need for alternate transportation (ie [place] or [place]), and denied having any safety concerns.  Duration of problem: past year; Severity of problem: moderate  OBJECTIVE: Mood: Anxious and Affect: within range Risk of harm to self or others: No plan to harm self or others  LIFE CONTEXT: Family and Social: Patient lives with partner in [place]. School/Work: Patient works at [employer]. Self-Care: Patient is able to tend to his ADL's. Life Changes: Increased frustration and satisfaction at work  ASSESSMENT: Patient has experienced increasing difficulty with managing and tolerating work frustrations and presents as "aggrevated" at work. Additionally patient is using multiple substances as a coping mechanism. Patient may benefit from further substance use assessment and treatment, affective disorder assessment, behavioral health services, and medication management.  GOALS ADDRESSED: Patient will: 1. Reduce symptoms of: agitation, anxiety, mood instability and stress 2. Increase knowledge and/or ability of: coping skills, healthy habits and stress reduction  3. Demonstrate ability to: Increase healthy adjustment to current life circumstances, Increase adequate support systems for patient/family and Decrease self-medicating behaviors  INTERVENTIONS: Interventions utilized: Motivational Interviewing and Supportive Counseling   PLAN: 1. Follow up with behavioral health clinician on : Wed Oct 10th at 10:15am 2. Behavioral recommendations: Begin to decrease Cannabis use and decrease use in situations where it is physically hazardous.   Continue to challenge impulsive thinking and not act on violent impulses.  Vergia Alberts, [facility]

## 2017-03-18 LAB — HIV-1 RNA QUANT-NO REFLEX-BLD
HIV 1 RNA Quant: <20 copies/mL
HIV-1 RNA Quant, Log: <1.3 Log copies/mL

## 2017-03-18 LAB — T-HELPER CELL (CD4) - (RCID CLINIC ONLY)
CD4 T CELL HELPER: 40 % (ref 33–55)
CD4 T Cell Abs: 740 /uL (ref 400–2700)

## 2017-03-18 LAB — URINE CYTOLOGY ANCILLARY ONLY
Chlamydia: NEGATIVE
NEISSERIA GONORRHEA: NEGATIVE

## 2017-03-24 ENCOUNTER — Ambulatory Visit: Payer: Self-pay | Admitting: Internal Medicine

## 2017-03-25 ENCOUNTER — Ambulatory Visit: Payer: Self-pay | Admitting: Licensed Clinical Social Worker

## 2017-03-25 ENCOUNTER — Telehealth: Payer: Self-pay | Admitting: Licensed Clinical Social Worker

## 2017-03-25 NOTE — Telephone Encounter (Signed)
Left message for patient regarding missed appointment and need to reschedule.  Vergia Alberts, [facility]

## 2017-04-08 ENCOUNTER — Other Ambulatory Visit: Payer: Self-pay | Admitting: Infectious Disease

## 2017-04-08 DIAGNOSIS — B20 Human immunodeficiency virus [HIV] disease: Secondary | ICD-10-CM

## 2017-06-23 ENCOUNTER — Encounter: Payer: Self-pay | Admitting: Internal Medicine

## 2017-06-23 ENCOUNTER — Ambulatory Visit (INDEPENDENT_AMBULATORY_CARE_PROVIDER_SITE_OTHER): Payer: Self-pay | Admitting: Internal Medicine

## 2017-06-23 VITALS — BP 144/90 | HR 96 | Temp 98.0°F | Ht 67.5 in | Wt 155.0 lb

## 2017-06-23 DIAGNOSIS — F320 Major depressive disorder, single episode, mild: Secondary | ICD-10-CM

## 2017-06-23 DIAGNOSIS — J189 Pneumonia, unspecified organism: Secondary | ICD-10-CM

## 2017-06-23 DIAGNOSIS — B20 Human immunodeficiency virus [HIV] disease: Secondary | ICD-10-CM

## 2017-06-23 DIAGNOSIS — J181 Lobar pneumonia, unspecified organism: Secondary | ICD-10-CM

## 2017-06-23 MED ORDER — AZITHROMYCIN 250 MG PO TABS: ORAL_TABLET | ORAL | 0 refills | Status: DC

## 2017-06-23 NOTE — Progress Notes (Signed)
RFV: follow up for hiv disease  Patient ID: Lawrence Bowen, male   DOB: [birth year], 37 y.o.   MRN: [id number]  HPI 37yo M with well controlled hiv disease, also hx of depression/anger management. He reports excellent adherence to his hiv medications. At his last visit, he was initiated on prozac which has helped. He appears improved with overall outlook since taking medications. Less angry at work. Reconnected with his grandmother which he feels that has also improved his depression. He is still semi-estranged to his mother who appears to try to distance him from his grandmother and extended family.  He met with clinic counselor x1 and didn't feel connection due to emphasis on having him quit smoking mj  Having productive cough and pleurisy over the last few days  Outpatient Encounter Medications as of 06/23/2017  Medication Sig  . abacavir-dolutegravir-lamiVUDine (TRIUMEQ) 600-50-300 MG tablet Take 1 tablet by mouth daily.  Marland Kitchen FLUoxetine (PROZAC) 20 MG capsule Take 1 capsule (20 mg total) by mouth daily.  . TRIUMEQ 935-70-177 MG tablet TAKE 1 TABLET BY MOUTH DAILY   No facility-administered encounter medications on file as of 06/23/2017.      Patient Active Problem List   Diagnosis Date Noted  . Weight gain 01/16/2016  . Major depression, recurrent ([place]) 02/14/2015  . 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 HTN (hypertension) 02/14/2015  . Depression 06/27/2013  . Generalized anxiety disorder 06/27/2013  . 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 Molluscum contagiosum 06/23/2012  . SWELLING MASS OR LUMP IN HEAD AND NECK 11/27/2008  . DECREASED LIBIDO 11/27/2008  . SKIN RASH, ALLERGIC 05/07/2007  . WEIGHT LOSS 05/07/2007  . 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 MALE PATTERN BALDNESS 04/01/2007  . SYMPTOM, COUGH 04/01/2007  . Human immunodeficiency virus (HIV) disease ([place]) 03/11/2007     There are no preventive care reminders to display for this  patient.  Soc hx - smokes marijuana Review of Systems Per hpi Physical Exam   BP (!) 144/90   Pulse 96   Temp 98 F (36.7 C) (Oral)   Ht 5' 7.5" (1.715 m)   Wt 155 lb (70.3 kg)   BMI 23.92 kg/m   Physical Exam  Constitutional: He is oriented to person, place, and time. He appears well-developed and well-nourished. No distress.  HENT:  Mouth/Throat: Oropharynx is clear and moist. No oropharyngeal exudate.  Cardiovascular: Normal rate, regular rhythm and normal heart sounds. Exam reveals no gallop and no friction rub.  No murmur heard.  Pulmonary/Chest: Effort normal and breath sounds normal. No respiratory distress.+rhonchi at right base Abdominal: Soft. Bowel sounds are normal. He exhibits no distension. There is no tenderness.  Lymphadenopathy:  He has no cervical adenopathy.  Neurological: He is alert and oriented to person, place, and time.  Skin: Skin is warm and dry. No rash noted. No erythema.  Psychiatric: He has a normal mood and affect. His behavior is normal.    Lab Results  Component Value Date   CD4TCELL 40 03/16/2017   Lab Results  Component Value Date   CD4TABS 740 03/16/2017   CD4TABS 530 07/31/2015   CD4TABS 430 04/14/2014   Lab Results  Component Value Date   HIV1RNAQUANT <20 NOT DETECTED 03/16/2017   Lab Results  Component Value Date   HEPBSAB NEG 03/11/2007   Lab Results  Component Value Date   LABRPR NON-REACTIVE 03/16/2017    CBC Lab Results  Component Value Date   WBC 7.3 03/16/2017  RBC 4.67 03/16/2017   HGB 15.3 03/16/2017   HCT 43.8 03/16/2017   PLT 218 03/16/2017   MCV 93.8 03/16/2017   MCH 32.8 03/16/2017   MCHC 34.9 03/16/2017   RDW 12.7 03/16/2017   LYMPHSABS 1,759 03/16/2017   MONOABS 0.4 07/31/2015   EOSABS 117 03/16/2017    BMET Lab Results  Component Value Date   NA 139 03/16/2017   K 3.9 03/16/2017   CL 105 03/16/2017   CO2 28 03/16/2017   GLUCOSE 91 03/16/2017   BUN 12 03/16/2017   CREATININE 1.09  03/16/2017   CALCIUM 9.0 03/16/2017   GFRNONAA 87 03/16/2017   GFRAA 101 03/16/2017      Assessment and Plan  hiv disease = continue on current regimen, well controlled. Will do some labs for next visit  Cap = Give course of azithro. If no improvement, will get cxr and discuss other course of treatment if needed  Depression = continue on prozac current dosage

## 2017-07-28 ENCOUNTER — Encounter: Payer: Self-pay | Admitting: Internal Medicine

## 2017-09-24 ENCOUNTER — Ambulatory Visit (INDEPENDENT_AMBULATORY_CARE_PROVIDER_SITE_OTHER): Payer: Self-pay | Admitting: Internal Medicine

## 2017-09-24 ENCOUNTER — Encounter: Payer: Self-pay | Admitting: Internal Medicine

## 2017-09-24 VITALS — BP 123/81 | HR 93 | Temp 98.3°F | Wt 159.0 lb

## 2017-09-24 DIAGNOSIS — B20 Human immunodeficiency virus [HIV] disease: Secondary | ICD-10-CM

## 2017-09-24 DIAGNOSIS — F320 Major depressive disorder, single episode, mild: Secondary | ICD-10-CM

## 2017-09-24 DIAGNOSIS — Z23 Encounter for immunization: Secondary | ICD-10-CM

## 2017-09-24 LAB — CBC WITH DIFFERENTIAL/PLATELET
Basophils Absolute: 40 cells/uL (ref 0–200)
Basophils Relative: 0.7 %
EOS PCT: 2.8 %
Eosinophils Absolute: 160 cells/uL (ref 15–500)
HEMATOCRIT: 43.9 % (ref 38.5–50.0)
HEMOGLOBIN: 15.3 g/dL (ref 13.2–17.1)
LYMPHS ABS: 1340 {cells}/uL (ref 850–3900)
MCH: 32.1 pg (ref 27.0–33.0)
MCHC: 34.9 g/dL (ref 32.0–36.0)
MCV: 92 fL (ref 80.0–100.0)
MPV: 9.8 fL (ref 7.5–12.5)
Monocytes Relative: 9.9 %
NEUTROS ABS: 3597 {cells}/uL (ref 1500–7800)
NEUTROS PCT: 63.1 %
Platelets: 233 10*3/uL (ref 140–400)
RBC: 4.77 10*6/uL (ref 4.20–5.80)
RDW: 12.6 % (ref 11.0–15.0)
Total Lymphocyte: 23.5 %
WBC mixed population: 564 cells/uL (ref 200–950)
WBC: 5.7 10*3/uL (ref 3.8–10.8)

## 2017-09-24 LAB — COMPLETE METABOLIC PANEL WITH GFR
AG RATIO: 2.2 (calc) (ref 1.0–2.5)
ALBUMIN MSPROF: 4.7 g/dL (ref 3.6–5.1)
ALKALINE PHOSPHATASE (APISO): 61 U/L (ref 40–115)
ALT: 20 U/L (ref 9–46)
AST: 24 U/L (ref 10–40)
BUN: 16 mg/dL (ref 7–25)
CO2: 32 mmol/L (ref 20–32)
CREATININE: 1.18 mg/dL (ref 0.60–1.35)
Calcium: 9.6 mg/dL (ref 8.6–10.3)
Chloride: 102 mmol/L (ref 98–110)
GFR, Est African American: 91 mL/min/{1.73_m2} (ref >=60)
GFR, Est Non African American: 78 mL/min/{1.73_m2} (ref >=60)
GLOBULIN: 2.1 g/dL (ref 1.9–3.7)
Glucose, Bld: 102 mg/dL — ABNORMAL HIGH (ref 65–99)
POTASSIUM: 4 mmol/L (ref 3.5–5.3)
SODIUM: 138 mmol/L (ref 135–146)
Total Bilirubin: 0.4 mg/dL (ref 0.2–1.2)
Total Protein: 6.8 g/dL (ref 6.1–8.1)

## 2017-09-24 MED ORDER — FLUOXETINE HCL 40 MG PO CAPS: 40.0000 mg | ORAL_CAPSULE | Freq: Every day | ORAL | 11 refills | Status: DC

## 2017-09-24 NOTE — Progress Notes (Signed)
RFV: follow up for hiv disease and depression  Patient ID: Lawrence BuzzardHoward Bowen, male   DOB: [birth year], 37 y.o.   MRN: [id number]  HPI Lawrence Bowen is a 37yo M with hiv disease, CD 4 count of 740/VL<20 (oct 2018) on triumeq. Feels like he needs increase in prozac. He states that someone who he works with unexpectedly died in MVA who [facility] will be [facility] [employer] [facility]. He reports that he is starting to mend some of his family ties.  Sochx: smokes marijuana only.  Outpatient Encounter Medications as of 09/24/2017  Medication Sig  . abacavir-dolutegravir-lamiVUDine (TRIUMEQ) 600-50-300 MG tablet Take 1 tablet by mouth daily.  Marland Kitchen. FLUoxetine (PROZAC) 20 MG capsule Take 1 capsule (20 mg total) by mouth daily.  . TRIUMEQ 600-50-300 MG tablet TAKE 1 TABLET BY MOUTH DAILY  . azithromycin (ZITHROMAX) 250 MG tablet Take 2 tabs by mouth on 1st day, then 1 tab daily until complete (Patient not taking: Reported on 09/24/2017)   No facility-administered encounter medications on file as of 09/24/2017.      Patient Active Problem List   Diagnosis Date Noted  . Weight gain 01/16/2016  . Major depression, recurrent (HCC) 02/14/2015  . 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 HTN (hypertension) 02/14/2015  . Depression 06/27/2013  . Generalized anxiety disorder 06/27/2013  . 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 Molluscum contagiosum 06/23/2012  . SWELLING MASS OR LUMP IN HEAD AND NECK 11/27/2008  . DECREASED LIBIDO 11/27/2008  . SKIN RASH, ALLERGIC 05/07/2007  . WEIGHT LOSS 05/07/2007  . 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 MALE PATTERN BALDNESS 04/01/2007  . SYMPTOM, COUGH 04/01/2007  . Human immunodeficiency virus (HIV) disease (HCC) 03/11/2007     There are no preventive care reminders to display for this patient.   Review of Systems Review of Systems  Constitutional: Negative for fever, chills, diaphoresis, activity change, appetite change, fatigue and unexpected  weight change.  HENT: Negative for congestion, sore throat, rhinorrhea, sneezing, trouble swallowing and sinus pressure.  Eyes: Negative for photophobia and visual disturbance.  Respiratory: Negative for cough, chest tightness, shortness of breath, wheezing and stridor.  Cardiovascular: Negative for chest pain, palpitations and leg swelling.  Gastrointestinal: Negative for nausea, vomiting, abdominal pain, diarrhea, constipation, blood in stool, abdominal distention and anal bleeding.  Genitourinary: Negative for dysuria, hematuria, flank pain and difficulty urinating.  Musculoskeletal: Negative for myalgias, back pain, joint swelling, arthralgias and gait problem.  Skin: Negative for color change, pallor, rash and wound.  Neurological: Negative for dizziness, tremors, weakness and light-headedness.  Hematological: Negative for adenopathy. Does not bruise/bleed easily.  Psychiatric/Behavioral:mild depression, no suicide ideation   Physical Exam   BP 123/81   Pulse 93   Temp 98.3 F (36.8 C) (Oral)   Wt 159 lb (72.1 kg)   BMI 24.54 kg/m   Physical Exam  Constitutional: He is oriented to person, place, and time. He appears well-developed and well-nourished. No distress.  HENT:  Mouth/Throat: Oropharynx is clear and moist. No oropharyngeal exudate.  Cardiovascular: Normal rate, regular rhythm and normal heart sounds. Exam reveals no gallop and no friction rub.  No murmur heard.  Pulmonary/Chest: Effort normal and breath sounds normal. No respiratory distress. He has no wheezes.  Abdominal: Soft. Bowel sounds are normal. He exhibits no distension. There is no tenderness.  Lymphadenopathy:  He has no cervical adenopathy.  Neurological: He is alert and oriented to person, place, and time.  Skin: Skin is warm and dry. No  rash noted. No erythema.  Psychiatric: He has a normal mood and affect. His behavior is normal.    Lab Results  Component Value Date   CD4TCELL 40 03/16/2017   Lab  Results  Component Value Date   CD4TABS 740 03/16/2017   CD4TABS 530 07/31/2015   CD4TABS 430 04/14/2014   Lab Results  Component Value Date   HIV1RNAQUANT <20 NOT DETECTED 03/16/2017   Lab Results  Component Value Date   HEPBSAB NEG 03/11/2007   Lab Results  Component Value Date   LABRPR NON-REACTIVE 03/16/2017    CBC Lab Results  Component Value Date   WBC 7.3 03/16/2017   RBC 4.67 03/16/2017   HGB 15.3 03/16/2017   HCT 43.8 03/16/2017   PLT 218 03/16/2017   MCV 93.8 03/16/2017   MCH 32.8 03/16/2017   MCHC 34.9 03/16/2017   RDW 12.7 03/16/2017   LYMPHSABS 1,759 03/16/2017   MONOABS 0.4 07/31/2015   EOSABS 117 03/16/2017    BMET Lab Results  Component Value Date   NA 139 03/16/2017   K 3.9 03/16/2017   CL 105 03/16/2017   CO2 28 03/16/2017   GLUCOSE 91 03/16/2017   BUN 12 03/16/2017   CREATININE 1.09 03/16/2017   CALCIUM 9.0 03/16/2017   GFRNONAA 87 03/16/2017   GFRAA 101 03/16/2017      Assessment and Plan  Depression/anxiety = will increase prozac to 40mg  daily. Offered counseling in clinic if he would like to pursue  hiv disease= well controlled continue on current regimen. Will check labs today  Health maintenance =Needs meningococcal booster. prevnar 13 at next visit  Will see back in 6 months

## 2017-09-25 LAB — T-HELPER CELL (CD4) - (RCID CLINIC ONLY)
CD4 % Helper T Cell: 40 % (ref 33–55)
CD4 T Cell Abs: 570 /uL (ref 400–2700)

## 2017-09-28 LAB — HIV-1 RNA QUANT-NO REFLEX-BLD
HIV 1 RNA QUANT: NOT DETECTED {copies}/mL
HIV-1 RNA Quant, Log: <1.3 Log copies/mL

## 2017-10-06 ENCOUNTER — Other Ambulatory Visit: Payer: Self-pay | Admitting: Internal Medicine

## 2017-10-06 ENCOUNTER — Other Ambulatory Visit: Payer: Self-pay

## 2017-10-06 DIAGNOSIS — B20 Human immunodeficiency virus [HIV] disease: Secondary | ICD-10-CM

## 2017-10-06 MED ORDER — ABACAVIR-DOLUTEGRAVIR-LAMIVUD 600-50-300 MG PO TABS: 1.0000 | ORAL_TABLET | Freq: Every day | ORAL | 11 refills | Status: DC

## 2018-03-29 ENCOUNTER — Ambulatory Visit (INDEPENDENT_AMBULATORY_CARE_PROVIDER_SITE_OTHER): Payer: Self-pay | Admitting: Internal Medicine

## 2018-03-29 ENCOUNTER — Encounter: Payer: Self-pay | Admitting: Internal Medicine

## 2018-03-29 VITALS — BP 159/75 | HR 92 | Temp 98.9°F | Ht 67.0 in | Wt 157.0 lb

## 2018-03-29 DIAGNOSIS — B20 Human immunodeficiency virus [HIV] disease: Secondary | ICD-10-CM

## 2018-03-29 DIAGNOSIS — Z23 Encounter for immunization: Secondary | ICD-10-CM

## 2018-03-29 DIAGNOSIS — B349 Viral infection, unspecified: Secondary | ICD-10-CM

## 2018-03-29 NOTE — Progress Notes (Signed)
Flu and Prevnar administered today. Patient tolerated well. Patient to follow up with with Dr. Drue Second in 4 month and consider Menveo #2 at that time.  S.Avanni Turnbaugh.

## 2018-03-29 NOTE — Progress Notes (Signed)
RFV: follow up for hiv disease  Patient ID: Lawrence Bowen, male   DOB: December 01, 1980, 37 y.o.   MRN: [id number]  HPI 37yo M with hiv disease, Cd 4 count of 570/VL<20 on triumeq.   Fever x 7 d but now improved. No fevers in 48hr. No unprotected sex since we last saw him in April. He reports "I feel happy" I have dealt with my feelings iwht my mother.  Outpatient Encounter Medications as of 03/29/2018  Medication Sig  . abacavir-dolutegravir-lamiVUDine (TRIUMEQ) 600-50-300 MG tablet Take 1 tablet by mouth daily.  Marland Kitchen azithromycin (ZITHROMAX) 250 MG tablet Take 2 tabs by mouth on 1st day, then 1 tab daily until complete  . FLUoxetine (PROZAC) 40 MG capsule Take 1 capsule (40 mg total) by mouth daily.  . TRIUMEQ 600-50-300 MG tablet TAKE 1 TABLET BY MOUTH DAILY   No facility-administered encounter medications on file as of 03/29/2018.      Patient Active Problem List   Diagnosis Date Noted  . Weight gain 01/16/2016  . Major depression, recurrent (HCC) 02/14/2015  . 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 HTN (hypertension) 02/14/2015  . Depression 06/27/2013  . Generalized anxiety disorder 06/27/2013  . 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 Molluscum contagiosum 06/23/2012  . SWELLING MASS OR LUMP IN HEAD AND NECK 11/27/2008  . DECREASED LIBIDO 11/27/2008  . SKIN RASH, ALLERGIC 05/07/2007  . WEIGHT LOSS 05/07/2007  . 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 MALE PATTERN BALDNESS 04/01/2007  . SYMPTOM, COUGH 04/01/2007  . Human immunodeficiency virus (HIV) disease (HCC) 03/11/2007     Health Maintenance Due  Topic Date Due  . INFLUENZA VACCINE  01/14/2018    Social History   Tobacco Use  . Smoking status: Never Smoker  . Smokeless tobacco: Never Used  Substance Use Topics  . Alcohol use: No  . Drug use: Yes    Frequency: 7.0 times per week    Types: Marijuana    Review of Systems 12 point ros is negative except what is mentioned in  hpi Physical Exam   BP (!) 159/75   Pulse 92   Temp 98.9 F (37.2 C)   Ht 5\' 7"  (1.702 m)   Wt 157 lb (71.2 kg)   BMI 24.59 kg/m   Physical Exam  Constitutional: He is oriented to person, place, and time. He appears well-developed and well-nourished. No distress.  HENT:  Mouth/Throat: Oropharynx is clear and moist. No oropharyngeal exudate.  Cardiovascular: Normal rate, regular rhythm and normal heart sounds. Exam reveals no gallop and no friction rub.  No murmur heard.  Pulmonary/Chest: Effort normal and breath sounds normal. No respiratory distress. He has no wheezes.  Abdominal: Soft. Bowel sounds are normal. He exhibits no distension. There is no tenderness.  Lymphadenopathy:  He has no cervical adenopathy.  Neurological: He is alert and oriented to person, place, and time.  Skin: Skin is warm and dry. No rash noted. No erythema.  Psychiatric: He has a normal mood and affect. His behavior is normal.    Lab Results  Component Value Date   CD4TCELL 40 09/24/2017   Lab Results  Component Value Date   CD4TABS 570 09/24/2017   CD4TABS 740 03/16/2017   CD4TABS 530 07/31/2015   Lab Results  Component Value Date   HIV1RNAQUANT <20 NOT DETECTED 09/24/2017   Lab Results  Component Value Date   HEPBSAB NEG 03/11/2007   Lab Results  Component Value Date  LABRPR NON-REACTIVE 03/16/2017    CBC Lab Results  Component Value Date   WBC 5.7 09/24/2017   RBC 4.77 09/24/2017   HGB 15.3 09/24/2017   HCT 43.9 09/24/2017   PLT 233 09/24/2017   MCV 92.0 09/24/2017   MCH 32.1 09/24/2017   MCHC 34.9 09/24/2017   RDW 12.6 09/24/2017   LYMPHSABS 1,340 09/24/2017   MONOABS 0.4 07/31/2015   EOSABS 160 09/24/2017    BMET Lab Results  Component Value Date   NA 138 09/24/2017   K 4.0 09/24/2017   CL 102 09/24/2017   CO2 32 09/24/2017   GLUCOSE 102 (H) 09/24/2017   BUN 16 09/24/2017   CREATININE 1.18 09/24/2017   CALCIUM 9.6 09/24/2017   GFRNONAA 78 09/24/2017   GFRAA  91 09/24/2017      Assessment and Plan  Health maintenance = will give flu and prevnar today, menveo at next visit  Viral illness = getting better. Last fever 48rhs ago  hiv disease= will check labs. Appears to be doing wel lbased on labs 6 months ago. Has had good adherence

## 2018-03-30 LAB — T-HELPER CELL (CD4) - (RCID CLINIC ONLY)
CD4 % Helper T Cell: 31 % — ABNORMAL LOW (ref 33–55)
CD4 T Cell Abs: 360 /uL — ABNORMAL LOW (ref 400–2700)

## 2018-03-31 LAB — LIPID PANEL
CHOLESTEROL: 113 mg/dL (ref <200)
HDL: 34 mg/dL — AB (ref >40)
LDL Cholesterol (Calc): 60 mg/dL (calc)
Non-HDL Cholesterol (Calc): 79 mg/dL (calc) (ref <130)
TRIGLYCERIDES: 104 mg/dL (ref <150)
Total CHOL/HDL Ratio: 3.3 (calc) (ref <5.0)

## 2018-03-31 LAB — BASIC METABOLIC PANEL
BUN: 8 mg/dL (ref 7–25)
CHLORIDE: 103 mmol/L (ref 98–110)
CO2: 30 mmol/L (ref 20–32)
CREATININE: 0.85 mg/dL (ref 0.60–1.35)
Calcium: 9.3 mg/dL (ref 8.6–10.3)
Glucose, Bld: 84 mg/dL (ref 65–99)
POTASSIUM: 4.3 mmol/L (ref 3.5–5.3)
SODIUM: 141 mmol/L (ref 135–146)

## 2018-03-31 LAB — CBC WITH DIFFERENTIAL/PLATELET
BASOS ABS: 39 {cells}/uL (ref 0–200)
Basophils Relative: 0.5 %
EOS ABS: 100 {cells}/uL (ref 15–500)
Eosinophils Relative: 1.3 %
HEMATOCRIT: 42 % (ref 38.5–50.0)
Hemoglobin: 14.3 g/dL (ref 13.2–17.1)
LYMPHS ABS: 1163 {cells}/uL (ref 850–3900)
MCH: 31.8 pg (ref 27.0–33.0)
MCHC: 34 g/dL (ref 32.0–36.0)
MCV: 93.5 fL (ref 80.0–100.0)
MPV: 9.3 fL (ref 7.5–12.5)
Monocytes Relative: 9.6 %
NEUTROS PCT: 73.5 %
Neutro Abs: 5660 cells/uL (ref 1500–7800)
Platelets: 320 10*3/uL (ref 140–400)
RBC: 4.49 10*6/uL (ref 4.20–5.80)
RDW: 11.6 % (ref 11.0–15.0)
Total Lymphocyte: 15.1 %
WBC: 7.7 10*3/uL (ref 3.8–10.8)
WBCMIX: 739 {cells}/uL (ref 200–950)

## 2018-03-31 LAB — HIV-1 RNA QUANT-NO REFLEX-BLD
HIV 1 RNA Quant: <20 copies/mL — AB
HIV-1 RNA QUANT, LOG: DETECTED {Log_copies}/mL — AB

## 2018-03-31 LAB — RPR: RPR Ser Ql: NONREACTIVE

## 2018-04-05 ENCOUNTER — Telehealth: Payer: Self-pay

## 2018-04-05 MED ORDER — FLUOXETINE HCL 40 MG PO CAPS: 40.0000 mg | ORAL_CAPSULE | Freq: Every day | ORAL | 11 refills | Status: DC

## 2018-04-05 NOTE — Telephone Encounter (Signed)
Patient is here to renew ADAP and has asked for a  Prozac script to take to pharmacy.

## 2018-04-06 ENCOUNTER — Encounter: Payer: Self-pay | Admitting: Internal Medicine

## 2018-07-23 DIAGNOSIS — R6889 Other general symptoms and signs: Secondary | ICD-10-CM | POA: Diagnosis not present

## 2018-07-23 DIAGNOSIS — B349 Viral infection, unspecified: Secondary | ICD-10-CM | POA: Diagnosis not present

## 2018-09-28 ENCOUNTER — Other Ambulatory Visit: Payer: Self-pay

## 2018-09-28 ENCOUNTER — Encounter: Payer: Self-pay | Admitting: Family

## 2018-09-28 ENCOUNTER — Ambulatory Visit (INDEPENDENT_AMBULATORY_CARE_PROVIDER_SITE_OTHER): Payer: BLUE CROSS/BLUE SHIELD | Admitting: Family

## 2018-09-28 DIAGNOSIS — Z0000 Encounter for general adult medical examination without abnormal findings: Secondary | ICD-10-CM | POA: Insufficient documentation

## 2018-09-28 DIAGNOSIS — B20 Human immunodeficiency virus [HIV] disease: Secondary | ICD-10-CM

## 2018-09-28 MED ORDER — ABACAVIR-DOLUTEGRAVIR-LAMIVUD 600-50-300 MG PO TABS: 1.0000 | ORAL_TABLET | Freq: Every day | ORAL | 5 refills | Status: DC

## 2018-09-28 NOTE — Progress Notes (Signed)
Subjective:    Patient ID: Lawrence BuzzardHoward Bowen, male    DOB: [birth year], 38 y.o.   MRN: [id number]  Chief Complaint  Patient presents with  . HIV Positive/AIDS     Virtual Visit via Telephone Note   I connected with [name] on 09/28/2018 at 10:15 am by telephone and verified that I am speaking with the correct person using two identifiers.   I discussed the limitations, risks, security and privacy concerns of performing an evaluation and management service by telephone and the availability of in person appointments. I also discussed with the patient that there may be a patient responsible charge related to this service. The patient expressed understanding and agreed to proceed.   HPI:  Lawrence Bowen is a 38 y.o. male who was last seen in the office on 03/29/18 with good adherence and tolerance to his ART regimen of Triumeq. Viral load at the time was undetectable with CD4 count of 360. No recent blood work has been completed. Healthcare maintenance due includes second dose of Menveo.   Lawrence Bowen has been taking his Triumeq as prescribed with no adverse side effects or missed doses.  Overall feels well and describes a decreased ability for sleep since he was sick in January sleeping up to a maximum of 3 hours at a time. Denies fevers, chills, night sweats, headaches, changes in vision, neck pain/stiffness, nausea, diarrhea, vomiting, lesions or rashes.  Lawrence Bow[facility] with no problems obtaining his medications.  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  He has noted slight increase in alcohol consumption since not working as much.  He is not currently sexually active but remains with a long-term partner.  Most recent dental screen is up-to-date per recommendations.  Denies feelings of being down, depressed, or hopeless.   Allergies  Allergen Reactions  . Apple Nausea And Vomiting  . Penicillins        Outpatient Medications Prior to Visit  Medication Sig Dispense Refill  . FLUoxetine (PROZAC) 40 MG capsule Take 1 capsule (40 mg total) by mouth daily. 30 capsule 11  . abacavir-dolutegravir-lamiVUDine (TRIUMEQ) 600-50-300 MG tablet Take 1 tablet by mouth daily. 30 tablet 11  . azithromycin (ZITHROMAX) 250 MG tablet Take 2 tabs by mouth on 1st day, then 1 tab daily until complete 6 each 0  . TRIUMEQ 600-50-300 MG tablet TAKE 1 TABLET BY MOUTH DAILY 30 tablet 5   No facility-administered medications prior to visit.      Past Medical History:  Diagnosis Date  . HIV infection (HCC)   . HSV-1 (herpes simplex virus 1) infection   . HTN (hypertension) 02/14/2015  . Major depression, recurrent (HCC) 02/14/2015  . Marijuana use 02/14/2015  . 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 Molluscum contagiosum     History reviewed. No pertinent surgical history.   Review of Systems  Constitutional: Negative for appetite change, chills, fatigue, fever and unexpected weight change.  Eyes: Negative for visual disturbance.  Respiratory: Negative for cough, chest tightness, shortness of breath and wheezing.   Cardiovascular: Negative for chest pain and leg swelling.  Gastrointestinal: Negative for abdominal pain, constipation, diarrhea, nausea and vomiting.  Genitourinary: Negative for dysuria, flank pain, frequency, genital sores, hematuria and urgency.  Skin: Negative for rash.  Allergic/Immunologic: Negative for immunocompromised state.  Neurological: Negative for dizziness and headaches.      Objective:    There were no vitals taken  for this visit. Nursing note and vital signs reviewed.  Physical Exam    Lawrence Bowen is pleasant to speak with and it sounds to be doing well. Assessment & Plan:   Problem List Items Addressed This Visit      Other   Human immunodeficiency virus (HIV) disease (HCC) - Primary    Lawrence Bowen appears to have well-controlled HIV disease with his current ART  regimen of Triumeq with good adherence and tolerance.  No symptoms of opportunistic infection or progressive HIV disease.  He has no problems obtaining his medication.  Continue current dose of Triumeq.  Plan for follow-up in 3 months or sooner if needed with lab work following appointment.      Relevant Medications   abacavir-dolutegravir-lamiVUDine (TRIUMEQ) 600-50-300 MG tablet   Healthcare maintenance     Due for second dose of Menveo at next office visit  Dental screening up-to-date per recommendations and completed within the last 6 months.  Discussed importance of safe sexual practice to reduce risk of acquisition and transmission of STI.          I have discontinued Selinda Michaels Triumeq and azithromycin. I am also having him maintain his FLUoxetine and abacavir-dolutegravir-lamiVUDine.   Meds ordered this encounter  Medications  . abacavir-dolutegravir-lamiVUDine (TRIUMEQ) 600-50-300 MG tablet    Sig: Take 1 tablet by mouth daily.    Dispense:  30 tablet    Refill:  5    Order Specific Question:   Supervising Provider    Answer:   Judyann Munson [phone]     I discussed the assessment and treatment plan with the patient. The patient was provided an opportunity to ask questions and all were answered. The patient agreed with the plan and demonstrated an understanding of the instructions.   The patient was advised to call back or seek an in-person evaluation if the symptoms worsen or if the condition fails to improve as anticipated.   I provided  15  minutes of non-face-to-face time during this encounter.  Follow-up: Return in about 3 months (around 12/28/2018), or if symptoms worsen or fail to improve.   Marcos Eke, MSN, FNP-C Nurse Practitioner [facility] Main number: [phone]

## 2018-09-28 NOTE — Assessment & Plan Note (Signed)
Lawrence Bowen appears to have well-controlled HIV disease with his current ART regimen of Triumeq with good adherence and tolerance.  No symptoms of opportunistic infection or progressive HIV disease.  He has no problems obtaining his medication.  Continue current dose of Triumeq.  Plan for follow-up in 3 months or sooner if needed with lab work following appointment.

## 2018-09-28 NOTE — Assessment & Plan Note (Signed)
   Due for second dose of Menveo at next office visit  Dental screening up-to-date per recommendations and completed within the last 6 months.  Discussed importance of safe sexual practice to reduce risk of acquisition and transmission of STI.

## 2018-09-28 NOTE — Patient Instructions (Signed)
Nice to speak with you today.  Please continue to take your Triumeq daily as prescribed.  Refills of your medication have been sent to the pharmacy.  Plan for follow-up in 3 months or sooner if needed with lab work following her appointment.

## 2018-10-12 ENCOUNTER — Other Ambulatory Visit: Payer: Self-pay | Admitting: Internal Medicine

## 2018-10-12 DIAGNOSIS — F320 Major depressive disorder, single episode, mild: Secondary | ICD-10-CM

## 2018-11-08 DIAGNOSIS — R030 Elevated blood-pressure reading, without diagnosis of hypertension: Secondary | ICD-10-CM | POA: Diagnosis not present

## 2018-11-08 DIAGNOSIS — R05 Cough: Secondary | ICD-10-CM | POA: Diagnosis not present

## 2018-11-08 DIAGNOSIS — R509 Fever, unspecified: Secondary | ICD-10-CM | POA: Diagnosis not present

## 2018-11-08 DIAGNOSIS — G4489 Other headache syndrome: Secondary | ICD-10-CM | POA: Diagnosis not present

## 2018-11-15 DIAGNOSIS — B20 Human immunodeficiency virus [HIV] disease: Secondary | ICD-10-CM | POA: Diagnosis not present

## 2018-11-15 DIAGNOSIS — R109 Unspecified abdominal pain: Secondary | ICD-10-CM | POA: Diagnosis not present

## 2018-11-15 DIAGNOSIS — E878 Other disorders of electrolyte and fluid balance, not elsewhere classified: Secondary | ICD-10-CM | POA: Diagnosis not present

## 2018-11-15 DIAGNOSIS — E872 Acidosis: Secondary | ICD-10-CM | POA: Diagnosis not present

## 2018-11-15 DIAGNOSIS — J159 Unspecified bacterial pneumonia: Secondary | ICD-10-CM | POA: Diagnosis not present

## 2018-11-15 DIAGNOSIS — R002 Palpitations: Secondary | ICD-10-CM | POA: Diagnosis not present

## 2018-11-15 DIAGNOSIS — A0839 Other viral enteritis: Secondary | ICD-10-CM | POA: Diagnosis not present

## 2018-11-15 DIAGNOSIS — R9431 Abnormal electrocardiogram [ECG] [EKG]: Secondary | ICD-10-CM | POA: Diagnosis not present

## 2018-11-15 DIAGNOSIS — Z20828 Contact with and (suspected) exposure to other viral communicable diseases: Secondary | ICD-10-CM | POA: Diagnosis not present

## 2018-11-15 DIAGNOSIS — R197 Diarrhea, unspecified: Secondary | ICD-10-CM | POA: Diagnosis not present

## 2018-11-15 DIAGNOSIS — I10 Essential (primary) hypertension: Secondary | ICD-10-CM | POA: Diagnosis not present

## 2018-11-15 DIAGNOSIS — K219 Gastro-esophageal reflux disease without esophagitis: Secondary | ICD-10-CM | POA: Diagnosis not present

## 2018-11-15 DIAGNOSIS — D473 Essential (hemorrhagic) thrombocythemia: Secondary | ICD-10-CM | POA: Diagnosis not present

## 2018-11-15 DIAGNOSIS — R112 Nausea with vomiting, unspecified: Secondary | ICD-10-CM | POA: Diagnosis not present

## 2018-11-15 DIAGNOSIS — E860 Dehydration: Secondary | ICD-10-CM | POA: Diagnosis not present

## 2018-11-15 DIAGNOSIS — A419 Sepsis, unspecified organism: Secondary | ICD-10-CM | POA: Diagnosis not present

## 2018-11-15 DIAGNOSIS — F1290 Cannabis use, unspecified, uncomplicated: Secondary | ICD-10-CM | POA: Diagnosis not present

## 2018-11-15 DIAGNOSIS — A084 Viral intestinal infection, unspecified: Secondary | ICD-10-CM | POA: Diagnosis not present

## 2018-11-15 DIAGNOSIS — K529 Noninfective gastroenteritis and colitis, unspecified: Secondary | ICD-10-CM | POA: Diagnosis not present

## 2018-11-15 DIAGNOSIS — J189 Pneumonia, unspecified organism: Secondary | ICD-10-CM | POA: Diagnosis not present

## 2018-11-15 DIAGNOSIS — F329 Major depressive disorder, single episode, unspecified: Secondary | ICD-10-CM | POA: Diagnosis not present

## 2018-11-15 DIAGNOSIS — Z21 Asymptomatic human immunodeficiency virus [HIV] infection status: Secondary | ICD-10-CM | POA: Diagnosis not present

## 2018-11-15 DIAGNOSIS — E876 Hypokalemia: Secondary | ICD-10-CM | POA: Diagnosis not present

## 2018-12-28 ENCOUNTER — Ambulatory Visit: Payer: BLUE CROSS/BLUE SHIELD | Admitting: Internal Medicine

## 2019-01-05 ENCOUNTER — Encounter: Payer: Self-pay | Admitting: Internal Medicine

## 2019-01-05 ENCOUNTER — Ambulatory Visit: Payer: BLUE CROSS/BLUE SHIELD | Admitting: Internal Medicine

## 2019-01-05 ENCOUNTER — Other Ambulatory Visit: Payer: Self-pay

## 2019-01-05 VITALS — BP 139/92 | HR 87 | Temp 98.2°F | Wt 162.0 lb

## 2019-01-05 DIAGNOSIS — B20 Human immunodeficiency virus [HIV] disease: Secondary | ICD-10-CM | POA: Diagnosis not present

## 2019-01-05 DIAGNOSIS — Z79899 Other long term (current) drug therapy: Secondary | ICD-10-CM | POA: Diagnosis not present

## 2019-01-05 NOTE — Progress Notes (Signed)
RFV: follow up for hiv disease  Patient ID: Lawrence Bowen, male   DOB: [birth year], 38 y.o.   MRN: [id number]  HPI 38yo M with HIV disease, CD 4 count of 360/VL <20 currently taking triumeq. Has good adherence. 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 He has been overall in good health.  Outpatient Encounter Medications as of 01/05/2019  Medication Sig  . abacavir-dolutegravir-lamiVUDine (TRIUMEQ) 600-50-300 MG tablet Take 1 tablet by mouth daily.  Marland Kitchen. FLUoxetine (PROZAC) 40 MG capsule TAKE 1 CAPSULE BY MOUTH EVERY DAY   No facility-administered encounter medications on file as of 01/05/2019.      Patient Active Problem List   Diagnosis Date Noted  . Healthcare maintenance 09/28/2018  . Weight gain 01/16/2016  . Major depression, recurrent (HCC) 02/14/2015  . 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 HTN (hypertension) 02/14/2015  . Depression 06/27/2013  . Generalized anxiety disorder 06/27/2013  . 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 Molluscum contagiosum 06/23/2012  . SWELLING MASS OR LUMP IN HEAD AND NECK 11/27/2008  . DECREASED LIBIDO 11/27/2008  . SKIN RASH, ALLERGIC 05/07/2007  . WEIGHT LOSS 05/07/2007  . 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 MALE PATTERN BALDNESS 04/01/2007  . SYMPTOM, COUGH 04/01/2007  . Human immunodeficiency virus (HIV) disease (HCC) 03/11/2007     There are no preventive care reminders to display for this patient.   Review of Systems Review of Systems  Constitutional: Negative for fever, chills, diaphoresis, activity change, appetite change, fatigue and unexpected weight change.  HENT: Negative for congestion, sore throat, rhinorrhea, sneezing, trouble swallowing and sinus pressure.  Eyes: Negative for photophobia and visual disturbance.  Respiratory: Negative for cough, chest tightness, shortness of breath, wheezing and stridor.  Cardiovascular: Negative for  chest pain, palpitations and leg swelling.  Gastrointestinal: Negative for nausea, vomiting, abdominal pain, diarrhea, constipation, blood in stool, abdominal distention and anal bleeding.  Genitourinary: Negative for dysuria, hematuria, flank pain and difficulty urinating.  Musculoskeletal: Negative for myalgias, back pain, joint swelling, arthralgias and gait problem.  Skin: Negative for color change, pallor, rash and wound.  Neurological: Negative for dizziness, tremors, weakness and light-headedness.  Hematological: Negative for adenopathy. Does not bruise/bleed easily.  Psychiatric/Behavioral: Negative for behavioral problems, confusion, sleep disturbance, dysphoric mood, decreased concentration and agitation.    Physical Exam   BP (!) 139/92   Pulse 87   Temp 98.2 F (36.8 C)   Wt 162 lb (73.5 kg)   BMI 25.37 kg/m   Physical Exam  Constitutional: He is oriented to person, place, and time. He appears well-developed and well-nourished. No distress.  HENT:  Mouth/Throat: Oropharynx is clear and moist. No oropharyngeal exudate.  Cardiovascular: Normal rate, regular rhythm and normal heart sounds. Exam reveals no gallop and no friction rub.  No murmur heard.  Pulmonary/Chest: Effort normal and breath sounds normal. No respiratory distress. He has no wheezes.  Abdominal: Soft. Bowel sounds are normal. He exhibits no distension. There is no tenderness.  Lymphadenopathy:  He has no cervical adenopathy.  Neurological: He is alert and oriented to person, place, and time.  Skin: Skin is warm and dry. No rash noted. No erythema.  Psychiatric: He has a normal mood and affect. His behavior is normal.    Lab Results  Component Value Date   CD4TCELL 31 (L) 03/29/2018   Lab Results  Component Value Date   CD4TABS 360 (L) 03/29/2018  CD4TABS 570 09/24/2017   CD4TABS 740 03/16/2017   Lab Results  Component Value Date   HIV1RNAQUANT <20 DETECTED (A) 03/29/2018   Lab Results   Component Value Date   HEPBSAB NEG 03/11/2007   Lab Results  Component Value Date   LABRPR NON-REACTIVE 03/29/2018    CBC Lab Results  Component Value Date   WBC 7.7 03/29/2018   RBC 4.49 03/29/2018   HGB 14.3 03/29/2018   HCT 42.0 03/29/2018   PLT 320 03/29/2018   MCV 93.5 03/29/2018   MCH 31.8 03/29/2018   MCHC 34.0 03/29/2018   RDW 11.6 03/29/2018   LYMPHSABS 1,163 03/29/2018   MONOABS 0.4 07/31/2015   EOSABS 100 03/29/2018    BMET Lab Results  Component Value Date   NA 141 03/29/2018   K 4.3 03/29/2018   CL 103 03/29/2018   CO2 30 03/29/2018   GLUCOSE 84 03/29/2018   BUN 8 03/29/2018   CREATININE 0.85 03/29/2018   CALCIUM 9.3 03/29/2018   GFRNONAA 78 09/24/2017   GFRAA 91 09/24/2017      Assessment and Plan  hiv disease = well controlled from last labs. Will check 6 months labs today. Anticipate that he will still be undetectable given his good adherence  Long term medication management= will check cr function today  Health maintenance =  Flu vaccine in the fall

## 2019-01-06 LAB — T-HELPER CELL (CD4) - (RCID CLINIC ONLY)
CD4 % Helper T Cell: 32 % — ABNORMAL LOW (ref 33–65)
CD4 T Cell Abs: 390 /uL — ABNORMAL LOW (ref 400–1790)

## 2019-01-10 LAB — CBC WITH DIFFERENTIAL/PLATELET
Absolute Monocytes: 536 cells/uL (ref 200–950)
Basophils Absolute: 20 cells/uL (ref 0–200)
Basophils Relative: 0.4 %
Eosinophils Absolute: 128 cells/uL (ref 15–500)
Eosinophils Relative: 2.5 %
HCT: 42 % (ref 38.5–50.0)
Hemoglobin: 14.3 g/dL (ref 13.2–17.1)
Lymphs Abs: 1183 cells/uL (ref 850–3900)
MCH: 29.9 pg (ref 27.0–33.0)
MCHC: 34 g/dL (ref 32.0–36.0)
MCV: 87.7 fL (ref 80.0–100.0)
MPV: 9 fL (ref 7.5–12.5)
Monocytes Relative: 10.5 %
Neutro Abs: 3233 cells/uL (ref 1500–7800)
Neutrophils Relative %: 63.4 %
Platelets: 382 10*3/uL (ref 140–400)
RBC: 4.79 10*6/uL (ref 4.20–5.80)
RDW: 13.6 % (ref 11.0–15.0)
Total Lymphocyte: 23.2 %
WBC: 5.1 10*3/uL (ref 3.8–10.8)

## 2019-01-10 LAB — COMPLETE METABOLIC PANEL WITH GFR
AG Ratio: 1.5 (calc) (ref 1.0–2.5)
ALT: 21 U/L (ref 9–46)
AST: 17 U/L (ref 10–40)
Albumin: 4.6 g/dL (ref 3.6–5.1)
Alkaline phosphatase (APISO): 84 U/L (ref 36–130)
BUN: 12 mg/dL (ref 7–25)
CO2: 31 mmol/L (ref 20–32)
Calcium: 9.6 mg/dL (ref 8.6–10.3)
Chloride: 101 mmol/L (ref 98–110)
Creat: 0.94 mg/dL (ref 0.60–1.35)
GFR, Est African American: 119 mL/min/{1.73_m2} (ref >=60)
GFR, Est Non African American: 102 mL/min/{1.73_m2} (ref >=60)
Globulin: 3 g/dL (calc) (ref 1.9–3.7)
Glucose, Bld: 87 mg/dL (ref 65–99)
Potassium: 5 mmol/L (ref 3.5–5.3)
Sodium: 141 mmol/L (ref 135–146)
Total Bilirubin: 0.4 mg/dL (ref 0.2–1.2)
Total Protein: 7.6 g/dL (ref 6.1–8.1)

## 2019-01-10 LAB — HIV-1 RNA QUANT-NO REFLEX-BLD
HIV 1 RNA Quant: <20 copies/mL
HIV-1 RNA Quant, Log: <1.3 Log copies/mL

## 2019-01-10 LAB — RPR: RPR Ser Ql: NONREACTIVE

## 2019-03-26 ENCOUNTER — Encounter: Payer: Self-pay | Admitting: *Deleted

## 2019-03-26 ENCOUNTER — Emergency Department
Admission: EM | Admit: 2019-03-26 | Discharge: 2019-03-26 | Disposition: A | Discharge status: Home / Self Care | Payer: BC Managed Care – PPO | Source: Home / Self Care | Attending: Emergency Medicine | Admitting: Emergency Medicine

## 2019-03-26 DIAGNOSIS — L03011 Cellulitis of right finger: Secondary | ICD-10-CM

## 2019-03-26 MED ORDER — DOXYCYCLINE HYCLATE 100 MG PO CAPS: 100.0000 mg | ORAL_CAPSULE | Freq: Two times a day (BID) | ORAL | 0 refills | Status: DC

## 2019-03-26 NOTE — ED Triage Notes (Signed)
Patient c/o soreness and swelling to right 4th finger with pus drainage.

## 2019-03-26 NOTE — ED Provider Notes (Signed)
[facility]    CSN: [id number] Arrival date & time: 03/26/19  1149      History   Chief Complaint Chief Complaint  Patient presents with  . Finger Infection    right 4th figner    HPI Lawrence Bowen is a 38 y.o. male.   HPI Patient c/o soreness and swelling to right 4th finger.  Had some mild pus drainage from the cuticle this morning, no longer draining pus. Denies fever or chills or nausea or vomiting.  No chest pain or shortness of breath.  No cough Past Medical History:  Diagnosis Date  . HIV infection ([place])   . HSV-1 (herpes simplex virus 1) infection   . HTN (hypertension) 02/14/2015  . Major depression, recurrent ([place]) 02/14/2015  . Marijuana use 02/14/2015  . 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 Molluscum contagiosum     Patient Active Problem List   Diagnosis Date Noted  . Healthcare maintenance 09/28/2018  . Weight gain 01/16/2016  . Major depression, recurrent ([place]) 02/14/2015  . 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 HTN (hypertension) 02/14/2015  . Depression 06/27/2013  . Generalized anxiety disorder 06/27/2013  . 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 Molluscum contagiosum 06/23/2012  . SWELLING MASS OR LUMP IN HEAD AND NECK 11/27/2008  . DECREASED LIBIDO 11/27/2008  . SKIN RASH, ALLERGIC 05/07/2007  . WEIGHT LOSS 05/07/2007  . 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 MALE PATTERN BALDNESS 04/01/2007  . SYMPTOM, COUGH 04/01/2007  . Human immunodeficiency virus (HIV) disease ([place]) 03/11/2007    History reviewed. No pertinent surgical history.     Home Medications    Prior to Admission medications   Medication Sig Start Date End Date Taking? Authorizing Provider  abacavir-dolutegravir-lamiVUDine (TRIUMEQ) 600-50-300 MG tablet Take 1 tablet by mouth daily. 09/28/18   Golden Circle, FNP  doxycycline (VIBRAMYCIN) 100 MG capsule Take 1 capsule (100 mg total) by mouth 2 (two) times daily. 03/26/19    Jacqulyn Cane, MD  FLUoxetine (PROZAC) 40 MG capsule TAKE 1 CAPSULE BY MOUTH EVERY DAY 10/12/18   Comer, Okey Regal, MD    Family History Family History  Problem Relation Age of Onset  . Kidney disease Mother   . Hypertension Mother   . Cancer Father   . Heart disease Father   . Hypertension Sister   . Heart disease Paternal Grandfather     Social History Social History   Tobacco Use  . Smoking status: Never Smoker  . Smokeless tobacco: Never Used  Substance Use Topics  . Alcohol use: No  . Drug use: Yes    Frequency: 7.0 times per week    Types: Marijuana     Allergies   Apple and Penicillins   Review of Systems Review of Systems  All other systems reviewed and are negative.  Pertinent items noted in HPI and remainder of comprehensive ROS otherwise negative.   Physical Exam Triage Vital Signs ED Triage Vitals  Enc Vitals Group     BP 03/26/19 1207 (!) 180/93     Pulse Rate 03/26/19 1207 96     Resp 03/26/19 1207 16     Temp 03/26/19 1207 98.7 F (37.1 C)     Temp Source 03/26/19 1207 Oral     SpO2 03/26/19 1207 98 %     Weight 03/26/19 1209 160 lb (72.6 kg)     Height 03/26/19 1209 5\' 8"  (1.727 m)     Head Circumference --  Peak Flow --      Pain Score 03/26/19 1208 5     Pain Loc --      Pain Edu? --      Excl. in GC? --    No data found.  Updated Vital Signs BP (!) 180/93 (BP Location: Right Arm)   Pulse 96   Temp 98.7 F (37.1 C) (Oral)   Resp 16   Ht 5\' 8"  (1.727 m)   Wt 72.6 kg   SpO2 98%   BMI 24.33 kg/m   Visual Acuity Right Eye Distance:   Left Eye Distance:   Bilateral Distance:    Right Eye Near:   Left Eye Near:    Bilateral Near:     Physical Exam Vitals signs reviewed.  Constitutional:      General: He is not in acute distress.    Appearance: He is well-developed.  HENT:     Head: Normocephalic and atraumatic.  Eyes:     General: No scleral icterus.    Pupils: Pupils are equal, round, and reactive to light.   Neck:     Musculoskeletal: Normal range of motion and neck supple.  Cardiovascular:     Rate and Rhythm: Normal rate and regular rhythm.  Pulmonary:     Effort: Pulmonary effort is normal.  Abdominal:     General: There is no distension.  Skin:    General: Skin is warm and dry.  Neurological:     Mental Status: He is alert and oriented to person, place, and time.     Cranial Nerves: No cranial nerve deficit.  Psychiatric:        Behavior: Behavior normal.   Right fourth finger: Cuticle area mildly red and indurated and mildly swollen and mildly tender.  No bleeding or fluctuance or drainage.  Fingernail intact.  Capillary refill and neurovascular intact   UC Treatments / Results  Labs (all labs ordered are listed, but only abnormal results are displayed) Labs Reviewed - No data to display  EKG   Radiology No results found.  Procedures Procedures (including critical care time)  Medications Ordered in UC Medications - No data to display  Initial Impression / Assessment and Plan / UC Course  I have reviewed the triage vital signs and the nursing notes.  Pertinent labs & imaging results that were available during my care of the patient were reviewed by me and considered in my medical decision making (see chart for details).      Final Clinical Impressions(s) / UC Diagnoses   Final diagnoses:  Paronychia of right ring finger  No evidence of fluctuance, I&D not indicated.  Patient agrees.  There is no drainage to send off for culture.   Discharge Instructions     Please read attached instruction sheet on paronychia, which is soft tissue infection around the cuticle of your finger. Because it is draining some on its own, you do not need surgical drainage.  Treatment is antibiotic, doxycycline twice a day for 10 days.  Prescription sent to your pharmacy.  Warm soaks 2 or 3 times a day. Excuse from work today, may return to work tomorrow if keep the finger covered with  Band-Aid and is not draining. Follow-up here or PCP if not improving in 5 to 7 days, sooner if worse or new symptoms.    ED Prescriptions    Medication Sig Dispense Auth. Provider   doxycycline (VIBRAMYCIN) 100 MG capsule Take 1 capsule (100 mg total) by mouth 2 (  two) times daily. 20 capsule Lajean ManesMassey, , MD     Also, BP recheck 154/92.  Advised to follow-up with PCP to have BP rechecked.  He voiced understanding.   Lajean ManesMassey, , MD 03/27/19 [phone]

## 2019-03-26 NOTE — Discharge Instructions (Addendum)
Please read attached instruction sheet on paronychia, which is soft tissue infection around the cuticle of your finger. Because it is draining some on its own, you do not need surgical drainage.  Treatment is antibiotic, doxycycline twice a day for 10 days.  Prescription sent to your pharmacy.  Warm soaks 2 or 3 times a day. Excuse from work today, may return to work tomorrow if keep the finger covered with Band-Aid and is not draining. Follow-up here or PCP if not improving in 5 to 7 days, sooner if worse or new symptoms.

## 2019-03-28 ENCOUNTER — Telehealth: Payer: Self-pay

## 2019-03-28 NOTE — Telephone Encounter (Signed)
Received call from patient requesting an appointment with Dr. Baxter Flattery to go over labs from July, also to discuss the possibility of doing disability due to concerns regarding most recent CD4. Patient also complains of feeling tired and having night sweats. Is concerned this is due to his CD4 count.  Attempted to call patient to schedule same day appointment with MD to discuss concerns. Unable to reach patient at this time nor leave a voicemail.  [place]

## 2019-03-28 NOTE — Telephone Encounter (Signed)
Patient left additional message stating to leave a message on his phone. His voicemail is full.   I was calling to offer appointment with Dr Baxter Flattery this week if he calls back,    Laverle Patter, RN

## 2019-03-29 NOTE — Telephone Encounter (Signed)
Patient called office today stating that he is feeling suicidal due to stress regarding diagnosis. States that he is upset that MD did not inform him that his CD4 was below 400. Surveyor, quantity tried to offer patient an appointment with MD later this week to talk about lab work and disability paper forms. Patient refused appointment and states that he wants to see someone today. Advised patient if he is feeling suicidal to go to ED to be offered services. Patient states he does not want to do this, and ended call.  [place]

## 2019-04-07 NOTE — Telephone Encounter (Signed)
I have called patient and left message. Luis, if he calls again please have tammie or if Marcie Bal is there to do intervention via phone and have him go to ED.

## 2019-04-20 ENCOUNTER — Other Ambulatory Visit: Payer: Self-pay

## 2019-04-20 DIAGNOSIS — B20 Human immunodeficiency virus [HIV] disease: Secondary | ICD-10-CM

## 2019-04-20 MED ORDER — TRIUMEQ 600-50-300 MG PO TABS: 1.0000 | ORAL_TABLET | Freq: Every day | ORAL | 5 refills | Status: DC

## 2019-04-27 ENCOUNTER — Ambulatory Visit: Payer: BC Managed Care – PPO

## 2019-05-23 ENCOUNTER — Encounter: Payer: Self-pay | Admitting: Emergency Medicine

## 2019-05-23 ENCOUNTER — Other Ambulatory Visit: Payer: Self-pay

## 2019-05-23 ENCOUNTER — Emergency Department
Admission: EM | Admit: 2019-05-23 | Discharge: 2019-05-23 | Disposition: A | Discharge status: Home / Self Care | Payer: BC Managed Care – PPO | Source: Home / Self Care

## 2019-05-23 DIAGNOSIS — L0201 Cutaneous abscess of face: Secondary | ICD-10-CM | POA: Diagnosis not present

## 2019-05-23 MED ORDER — CLINDAMYCIN HCL 300 MG PO CAPS: 300.0000 mg | ORAL_CAPSULE | Freq: Three times a day (TID) | ORAL | 0 refills | Status: AC

## 2019-05-23 MED ORDER — AMOXICILLIN-POT CLAVULANATE 875-125 MG PO TABS: 1.0000 | ORAL_TABLET | Freq: Two times a day (BID) | ORAL | 0 refills | Status: DC

## 2019-05-23 NOTE — ED Triage Notes (Signed)
Patient has made appt in January to address probable hypertension.

## 2019-05-23 NOTE — Discharge Instructions (Addendum)
Keep area(s) clean and dry. Apply hot compress / towel for 5-10 minutes 3-5 times daily. Take antibiotic as prescribed with food - important to complete course. Return for worsening pain, redness, swelling, discharge, fever.  Helpful prevention tips: Keep nails short to avoid secondary skin infections. Use new, clean razors when shaving. Avoid antiperspirants - look for deodorants without aluminum. Avoid wearing underwire bras as this can irritate the area further.    For pain: recommend 350 mg-1000 mg of Tylenol (acetaminophen) and/or 200 mg - 800 mg of Advil (ibuprofen, Motrin) every 8 hours as needed.  May alternate between the two throughout the day as they are generally safe to take together.  DO NOT exceed more than 3000 mg of Tylenol or 3200 mg of ibuprofen in a 24 hour period as this could damage your stomach, kidneys, liver, or increase your bleeding risk.

## 2019-05-23 NOTE — ED Provider Notes (Signed)
[facility]    CSN: [id number] Arrival date & time: 05/23/19  1539      History   Chief Complaint Chief Complaint  Patient presents with  . Mass    left eyebrow    HPI Lawrence Bowen is a 38 y.o. male with history of hypertension, HIV presenting for 3-day course of left eyebrow pain, swelling, erythema.  States that he noticed a pimple about 3 days ago, tried to pop it, though had worsening pain, redness, swelling.  States he has been using ice for swelling and heat for pain with some relief.  Does endorse pain that extends into forehead.  Patient denies eye pain, change in vision, decreased EOM.  Patient compliant with retroviral therapy, followed by infectious disease for HIV.  Patient is hypertensive in office, though denies symptoms such as headache, chest pain, shortness of breath, severe abdominal pain.   Past Medical History:  Diagnosis Date  . HIV infection ([place])   . HSV-1 (herpes simplex virus 1) infection   . HTN (hypertension) 02/14/2015  . Major depression, recurrent ([place]) 02/14/2015  . Marijuana use 02/14/2015  . 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 Molluscum contagiosum     Patient Active Problem List   Diagnosis Date Noted  . Healthcare maintenance 09/28/2018  . Weight gain 01/16/2016  . Major depression, recurrent ([place]) 02/14/2015  . 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 HTN (hypertension) 02/14/2015  . Depression 06/27/2013  . Generalized anxiety disorder 06/27/2013  . 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 Molluscum contagiosum 06/23/2012  . SWELLING MASS OR LUMP IN HEAD AND NECK 11/27/2008  . DECREASED LIBIDO 11/27/2008  . SKIN RASH, ALLERGIC 05/07/2007  . WEIGHT LOSS 05/07/2007  . 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 MALE PATTERN BALDNESS 04/01/2007  . SYMPTOM, COUGH 04/01/2007  . Human immunodeficiency virus (HIV) disease ([place]) 03/11/2007    History reviewed. No pertinent surgical history.     Home Medications    Prior to Admission medications   Medication Sig Start Date End Date Taking? Authorizing Provider  abacavir-dolutegravir-lamiVUDine (TRIUMEQ) 600-50-300 MG tablet Take 1 tablet by mouth daily. 04/20/19   Carlyle Basques, MD  amoxicillin-clavulanate (AUGMENTIN) 875-125 MG tablet Take 1 tablet by mouth every 12 (twelve) hours. 05/23/19   Hall-Potvin, [place], PA-C  clindamycin (CLEOCIN) 300 MG capsule Take 1 capsule (300 mg total) by mouth 3 (three) times daily for 7 days. 05/23/19 05/30/19  Hall-Potvin, [place], PA-C  FLUoxetine (PROZAC) 40 MG capsule TAKE 1 CAPSULE BY MOUTH EVERY DAY 10/12/18   Comer, Okey Regal, MD    Family History Family History  Problem Relation Age of Onset  . Kidney disease Mother   . Hypertension Mother   . Cancer Father   . Heart disease Father   . Hypertension Sister   . Heart disease Paternal Grandfather     Social History Social History   Tobacco Use  . Smoking status: Never Smoker  . Smokeless tobacco: Never Used  Substance Use Topics  . Alcohol use: No  . Drug use: Yes    Frequency: 7.0 times per week    Types: Marijuana     Allergies   Apple and Penicillins   Review of Systems Review of Systems  Constitutional: Negative for fatigue and fever.  Respiratory: Negative for cough and shortness of breath.   Cardiovascular: Negative for chest pain and palpitations.  Gastrointestinal: Negative for abdominal pain, diarrhea and vomiting.  Musculoskeletal: Negative for arthralgias and myalgias.  Skin: Positive for wound. Negative for rash.  Neurological: Negative for speech difficulty and headaches.  All other systems reviewed and are negative.    Physical Exam Triage Vital Signs ED Triage Vitals  Enc Vitals Group     BP      Pulse      Resp      Temp      Temp src      SpO2      Weight      Height      Head Circumference      Peak Flow      Pain Score      Pain Loc      Pain Edu?      Excl. in GC?    No data  found.  Updated Vital Signs BP (!) 159/111 (BP Location: Right Arm)   Pulse 95   Temp 98.6 F (37 C) (Oral)   Resp 18   Ht 5\' 8"  (1.727 m)   Wt 160 lb (72.6 kg)   SpO2 97%   BMI 24.33 kg/m   Visual Acuity Right Eye Distance:   Left Eye Distance:   Bilateral Distance:    Right Eye Near:   Left Eye Near:    Bilateral Near:     Physical Exam Constitutional:      General: He is not in acute distress. HENT:     Head: Normocephalic and atraumatic.  Eyes:     General: No scleral icterus.    Pupils: Pupils are equal, round, and reactive to light.  Cardiovascular:     Rate and Rhythm: Normal rate and regular rhythm.  Pulmonary:     Effort: Pulmonary effort is normal. No respiratory distress.     Breath sounds: No stridor. No wheezing.  Skin:    Capillary Refill: Capillary refill takes less than 2 seconds.     Comments: Left eyebrow with 1 cm round area of induration.  No fluctuance.  Patient does have crusting without active discharge.  Lesion does have surrounding erythema, warmth that extends towards superior aspect of periorbital area.  No eyelid involvement.  No streaking over forehead.  Neurological:     Mental Status: He is alert and oriented to person, place, and time.     Cranial Nerves: No cranial nerve deficit.     Sensory: No sensory deficit.      UC Treatments / Results  Labs (all labs ordered are listed, but only abnormal results are displayed) Labs Reviewed - No data to display  EKG   Radiology No results found.  Procedures Procedures (including critical care time)  Medications Ordered in UC Medications - No data to display  Initial Impression / Assessment and Plan / UC Course  I have reviewed the triage vital signs and the nursing notes.  Pertinent labs & imaging results that were available during my care of the patient were reviewed by me and considered in my medical decision making (see chart for details).     Patient afebrile, nontoxic.   Last CD4 count done July 2020, reviewed by me at time appointment: 390.  Patient has appointment pending in January with his ID provider: Intends to keep this.  Given immunocompromise status with periorbital erythema, will cover for preseptal cellulitis as outlined below.  Continue hot compresses, analgesia as outlined below.  Return precautions discussed, patient verbalized understanding and is agreeable to plan. Final Clinical Impressions(s) / UC Diagnoses   Final diagnoses:  Abscess of face  Discharge Instructions     Keep area(s) clean and dry. Apply hot compress / towel for 5-10 minutes 3-5 times daily. Take antibiotic as prescribed with food - important to complete course. Return for worsening pain, redness, swelling, discharge, fever.  Helpful prevention tips: Keep nails short to avoid secondary skin infections. Use new, clean razors when shaving. Avoid antiperspirants - look for deodorants without aluminum. Avoid wearing underwire bras as this can irritate the area further.    For pain: recommend 350 mg-1000 mg of Tylenol (acetaminophen) and/or 200 mg - 800 mg of Advil (ibuprofen, Motrin) every 8 hours as needed.  May alternate between the two throughout the day as they are generally safe to take together.  DO NOT exceed more than 3000 mg of Tylenol or 3200 mg of ibuprofen in a 24 hour period as this could damage your stomach, kidneys, liver, or increase your bleeding risk.    ED Prescriptions    Medication Sig Dispense Auth. Provider   amoxicillin-clavulanate (AUGMENTIN) 875-125 MG tablet Take 1 tablet by mouth every 12 (twelve) hours. 14 tablet Hall-Potvin, [place], PA-C   clindamycin (CLEOCIN) 300 MG capsule Take 1 capsule (300 mg total) by mouth 3 (three) times daily for 7 days. 21 capsule Hall-Potvin, [place], PA-C     PDMP not reviewed this encounter.   Hall-Potvin, [place], [place] 05/23/19 1800

## 2019-05-23 NOTE — ED Triage Notes (Signed)
Patient noticed pimple in left eyebrow area 3 days ago; tried to express it and now it is extremely sore with pain all around left eye and up into scalp area.   He has not been exposed to covid positive person nor has he travelled.  He has not had influenza vacc this season.

## 2019-05-24 DIAGNOSIS — D101 Benign neoplasm of tongue: Secondary | ICD-10-CM | POA: Diagnosis not present

## 2019-05-31 DIAGNOSIS — D101 Benign neoplasm of tongue: Secondary | ICD-10-CM | POA: Diagnosis not present

## 2019-06-30 ENCOUNTER — Other Ambulatory Visit: Payer: Self-pay | Admitting: Internal Medicine

## 2019-06-30 DIAGNOSIS — F320 Major depressive disorder, single episode, mild: Secondary | ICD-10-CM

## 2019-07-08 ENCOUNTER — Telehealth: Payer: Self-pay | Admitting: *Deleted

## 2019-07-08 NOTE — Telephone Encounter (Signed)
Attempted to contact patient. His mailbox is full and he can not accept voicemails at this time. Will send patient a MyChart message. Valarie Cones

## 2019-07-08 NOTE — Telephone Encounter (Signed)
-----   Message from [facility] sent at 07/08/2019 12:13 PM EST ----- Patient called to confirm appointment for 07/13/2019 and is requesting medication for blood pressure and a letter for disability.

## 2019-07-13 ENCOUNTER — Ambulatory Visit (INDEPENDENT_AMBULATORY_CARE_PROVIDER_SITE_OTHER): Payer: BC Managed Care – PPO | Admitting: Internal Medicine

## 2019-07-13 ENCOUNTER — Telehealth: Payer: Self-pay

## 2019-07-13 ENCOUNTER — Other Ambulatory Visit: Payer: Self-pay

## 2019-07-13 VITALS — BP 161/122 | HR 99 | Wt 169.8 lb

## 2019-07-13 DIAGNOSIS — B20 Human immunodeficiency virus [HIV] disease: Secondary | ICD-10-CM | POA: Diagnosis not present

## 2019-07-13 DIAGNOSIS — R197 Diarrhea, unspecified: Secondary | ICD-10-CM | POA: Diagnosis not present

## 2019-07-13 DIAGNOSIS — R21 Rash and other nonspecific skin eruption: Secondary | ICD-10-CM

## 2019-07-13 MED ORDER — LOPERAMIDE HCL 2 MG PO TABS: 2.0000 mg | ORAL_TABLET | Freq: Four times a day (QID) | ORAL | 0 refills | Status: DC | PRN

## 2019-07-13 MED ORDER — CHLORHEXIDINE GLUCONATE 4 % EX SOLN: 1.0000 | Freq: Every day | CUTANEOUS | 1 refills | Status: DC

## 2019-07-13 NOTE — Telephone Encounter (Signed)
Patient left appointment abruptly. Called him to check in and let him know that Dr. Drue Second had orders and referrals in place for him. Patient stated that he did not want to come back to the clinic and wanted to be referred to [facility] instead. Patient hung up before RN could ask him to schedule with [facility] and sign a release of information paperwork.   Aleysha Meckler Loyola Mast, RN

## 2019-07-13 NOTE — Progress Notes (Signed)
RFV: follow up for hiv disease  Patient ID: Lawrence Bowen, male   DOB: 1980/07/15, 39 y.o.   MRN: [id number]  HPI 39yo M with triumeq, he reports that he has not been feeling well and has been  Treated for skin infection by urgent care  Unable to work 6 hr per day due to fatigue and diarrhea. He would like to pursue starting disability.  ROS: Fatigue "Diarrhea" - not every day. 3-4 loose stools Nightsweats/ Weight gain   Outpatient Encounter Medications as of 07/13/2019  Medication Sig  . abacavir-dolutegravir-lamiVUDine (TRIUMEQ) 600-50-300 MG tablet Take 1 tablet by mouth daily.  Marland Kitchen amoxicillin-clavulanate (AUGMENTIN) 875-125 MG tablet Take 1 tablet by mouth every 12 (twelve) hours.  Marland Kitchen FLUoxetine (PROZAC) 40 MG capsule TAKE 1 CAPSULE BY MOUTH EVERY DAY   No facility-administered encounter medications on file as of 07/13/2019.     Patient Active Problem List   Diagnosis Date Noted  . Healthcare maintenance 09/28/2018  . Weight gain 01/16/2016  . Major depression, recurrent ([place]) 02/14/2015  . 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 HTN (hypertension) 02/14/2015  . Depression 06/27/2013  . Generalized anxiety disorder 06/27/2013  . 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 Molluscum contagiosum 06/23/2012  . SWELLING MASS OR LUMP IN HEAD AND NECK 11/27/2008  . DECREASED LIBIDO 11/27/2008  . SKIN RASH, ALLERGIC 05/07/2007  . WEIGHT LOSS 05/07/2007  . 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 MALE PATTERN BALDNESS 04/01/2007  . SYMPTOM, COUGH 04/01/2007  . Human immunodeficiency virus (HIV) disease ([place]) 03/11/2007     Health Maintenance Due  Topic Date Due  . INFLUENZA VACCINE  01/15/2019     Physical Exam   BP (!) 161/122   Pulse 99   Wt 169 lb 12.8 oz (77 kg)   BMI 25.82 kg/m   gen = a x o by 3 in nad HEENT = perrla, eomi, no OP erythema Neck = supple no LAD pulm = cTAB no w/c/r Cors = no s1,s2 no g/m/r Ext = no c/c/e Skin  = scattered macules healed on forearms Lab Results  Component Value Date   CD4TCELL 32 (L) 01/05/2019   Lab Results  Component Value Date   CD4TABS 390 (L) 01/05/2019   CD4TABS 360 (L) 03/29/2018   CD4TABS 570 09/24/2017   Lab Results  Component Value Date   HIV1RNAQUANT <20 NOT DETECTED 01/05/2019   Lab Results  Component Value Date   HEPBSAB NEG 03/11/2007   Lab Results  Component Value Date   LABRPR NON-REACTIVE 01/05/2019    CBC Lab Results  Component Value Date   WBC 5.1 01/05/2019   RBC 4.79 01/05/2019   HGB 14.3 01/05/2019   HCT 42.0 01/05/2019   PLT 382 01/05/2019   MCV 87.7 01/05/2019   MCH 29.9 01/05/2019   MCHC 34.0 01/05/2019   RDW 13.6 01/05/2019   LYMPHSABS 1,183 01/05/2019   MONOABS 0.4 07/31/2015   EOSABS 128 01/05/2019    BMET Lab Results  Component Value Date   NA 141 01/05/2019   K 5.0 01/05/2019   CL 101 01/05/2019   CO2 31 01/05/2019   GLUCOSE 87 01/05/2019   BUN 12 01/05/2019   CREATININE 0.94 01/05/2019   CALCIUM 9.6 01/05/2019   GFRNONAA 102 01/05/2019   GFRAA 119 01/05/2019      Assessment and Plan  hiv disease = will check hiv studies, but suspect that he is still undetectable since he takes his triumeq daily  Fatigue/nightsweats =  will check thyroid infection,  Testosterone, quantiferon, cxr  Diarrhea = will check GI pathogen, but no history of recent travel. Clinical history suggests intermittent. Can do a trial imodium on days that it is worse. Recommend to see if any association with foods. 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 Can consider to do Decolonization with mupirocin x 10 days. CHG - bath x 10 d -decrease skin outbreaks  In terms of disability = I mentioned that I don't necessarily do the exams for disability, nor do any of my partners, but would see what we can find in terms of physicians to see for  evaluation. I mentioned that once symptoms of skin infection, diarrhea, resolve that he will not likely qualify for disability. Process takes several years given his current state of health.  ------------------- Patient left abruptly from visit, and has decided to not continue to be in care with our clinic. Would [facility] to [facility] to [facility]

## 2019-07-13 NOTE — Patient Instructions (Signed)
You can take imodium for your loose stools to see if it improves

## 2019-07-26 DIAGNOSIS — Z6827 Body mass index (BMI) 27.0-27.9, adult: Secondary | ICD-10-CM | POA: Diagnosis not present

## 2019-07-26 DIAGNOSIS — R079 Chest pain, unspecified: Secondary | ICD-10-CM | POA: Diagnosis not present

## 2019-07-26 DIAGNOSIS — R030 Elevated blood-pressure reading, without diagnosis of hypertension: Secondary | ICD-10-CM | POA: Diagnosis not present

## 2019-07-27 DIAGNOSIS — B20 Human immunodeficiency virus [HIV] disease: Secondary | ICD-10-CM | POA: Diagnosis not present

## 2019-07-27 DIAGNOSIS — Z23 Encounter for immunization: Secondary | ICD-10-CM | POA: Diagnosis not present

## 2019-07-27 DIAGNOSIS — R61 Generalized hyperhidrosis: Secondary | ICD-10-CM | POA: Diagnosis not present

## 2019-07-27 DIAGNOSIS — R5383 Other fatigue: Secondary | ICD-10-CM | POA: Diagnosis not present

## 2019-08-23 DIAGNOSIS — H01004 Unspecified blepharitis left upper eyelid: Secondary | ICD-10-CM | POA: Diagnosis not present

## 2019-08-31 DIAGNOSIS — F341 Dysthymic disorder: Secondary | ICD-10-CM | POA: Diagnosis not present

## 2019-08-31 DIAGNOSIS — B20 Human immunodeficiency virus [HIV] disease: Secondary | ICD-10-CM | POA: Diagnosis not present

## 2020-01-11 ENCOUNTER — Encounter: Payer: Self-pay | Admitting: *Deleted

## 2020-01-11 ENCOUNTER — Emergency Department: Admit: 2020-01-11 | Discharge status: Absent without leave | Payer: Self-pay

## 2020-01-11 ENCOUNTER — Emergency Department (INDEPENDENT_AMBULATORY_CARE_PROVIDER_SITE_OTHER): Payer: BC Managed Care – PPO

## 2020-01-11 ENCOUNTER — Other Ambulatory Visit: Payer: Self-pay

## 2020-01-11 ENCOUNTER — Emergency Department (INDEPENDENT_AMBULATORY_CARE_PROVIDER_SITE_OTHER)
Admission: EM | Admit: 2020-01-11 | Discharge: 2020-01-11 | Disposition: A | Discharge status: Home / Self Care | Payer: BC Managed Care – PPO | Source: Home / Self Care

## 2020-01-11 DIAGNOSIS — M25571 Pain in right ankle and joints of right foot: Secondary | ICD-10-CM | POA: Diagnosis not present

## 2020-01-11 DIAGNOSIS — S93601A Unspecified sprain of right foot, initial encounter: Secondary | ICD-10-CM

## 2020-01-11 DIAGNOSIS — M79671 Pain in right foot: Secondary | ICD-10-CM | POA: Diagnosis not present

## 2020-01-11 DIAGNOSIS — W19XXXA Unspecified fall, initial encounter: Secondary | ICD-10-CM | POA: Diagnosis not present

## 2020-01-11 DIAGNOSIS — S99921A Unspecified injury of right foot, initial encounter: Secondary | ICD-10-CM | POA: Diagnosis not present

## 2020-01-11 DIAGNOSIS — S99911A Unspecified injury of right ankle, initial encounter: Secondary | ICD-10-CM | POA: Diagnosis not present

## 2020-01-11 NOTE — Discharge Instructions (Signed)
  You may take 500mg  acetaminophen every 4-6 hours or in combination with ibuprofen 400-600mg  every 6-8 hours as needed for pain and inflammation.  Call to schedule a follow up with Sports Medicine in 1-2 weeks if not improving.

## 2020-01-11 NOTE — ED Triage Notes (Addendum)
Pt c/o RT foot/ankle pain post fall yesterday. Took Advil yesterday.

## 2020-01-11 NOTE — ED Provider Notes (Signed)
[facility]    CSN: [id number] Arrival date & time: 01/11/20  7371      History   Chief Complaint Chief Complaint  Patient presents with  . Foot Pain    HPI Lawrence Bowen is a 39 y.o. male.   HPI  Lawrence Bowen is a 39 y.o. male presenting to UC with c/o Right ankle/foot pain that started yesterday after falling while carrying a heavy object. Pain is aching and sore, 5/10, worse with ambulation. No other injuries from the fall.   Past Medical History:  Diagnosis Date  . HIV infection (HCC)   . HSV-1 (herpes simplex virus 1) infection   . HTN (hypertension) 02/14/2015  . Major depression, recurrent (HCC) 02/14/2015  . Marijuana use 02/14/2015  . 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 Molluscum contagiosum     Patient Active Problem List   Diagnosis Date Noted  . Healthcare maintenance 09/28/2018  . Weight gain 01/16/2016  . Major depression, recurrent (HCC) 02/14/2015  . 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 HTN (hypertension) 02/14/2015  . Depression 06/27/2013  . Generalized anxiety disorder 06/27/2013  . 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 Molluscum contagiosum 06/23/2012  . SWELLING MASS OR LUMP IN HEAD AND NECK 11/27/2008  . DECREASED LIBIDO 11/27/2008  . SKIN RASH, ALLERGIC 05/07/2007  . WEIGHT LOSS 05/07/2007  . 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 MALE PATTERN BALDNESS 04/01/2007  . SYMPTOM, COUGH 04/01/2007  . Human immunodeficiency virus (HIV) disease (HCC) 03/11/2007    History reviewed. No pertinent surgical history.     Home Medications    Prior to Admission medications   Medication Sig Start Date End Date Taking? Authorizing Provider  Dolutegravir-lamiVUDine 50-300 MG TABS Take 1 tablet by mouth daily. 09/30/19  Yes [provider]  FLUoxetine (PROZAC) 40 MG capsule TAKE 1 CAPSULE BY MOUTH EVERY DAY 06/30/19  Yes Comer, Belia Heman, MD    Family History Family History  Problem  Relation Age of Onset  . Kidney disease Mother   . Hypertension Mother   . Cancer Father   . Heart disease Father   . Hypertension Sister   . Heart disease Paternal Grandfather     Social History Social History   Tobacco Use  . Smoking status: Never Smoker  . Smokeless tobacco: Never Used  Vaping Use  . Vaping Use: Former  . Start date: 01/27/2018  . Quit date: 03/22/2018  Substance Use Topics  . Alcohol use: No  . Drug use: Yes    Frequency: 7.0 times per week    Types: Marijuana     Allergies   Apple and Penicillins   Review of Systems Review of Systems  Musculoskeletal: Positive for arthralgias and gait problem. Negative for joint swelling.  Skin: Positive for color change. Negative for wound.  Neurological: Negative for weakness and numbness.     Physical Exam Triage Vital Signs ED Triage Vitals  Enc Vitals Group     BP 01/11/20 0931 (!) 143/98     Pulse Rate 01/11/20 0931 90     Resp 01/11/20 0931 16     Temp 01/11/20 0931 98 F (36.7 C)     Temp Source 01/11/20 0931 Oral     SpO2 01/11/20 0931 97 %     Weight 01/11/20 0932 165 lb (74.8 kg)     Height 01/11/20 0932 5\' 8"  (1.727 m)     Head Circumference --  Peak Flow --      Pain Score 01/11/20 0931 5     Pain Loc --      Pain Edu? --      Excl. in GC? --    No data found.  Updated Vital Signs BP (!) 143/98 (BP Location: Right Arm)   Pulse 90   Temp 98 F (36.7 C) (Oral)   Resp 16   Ht 5\' 8"  (1.727 m)   Wt 165 lb (74.8 kg)   SpO2 97%   BMI 25.09 kg/m   Visual Acuity Right Eye Distance:   Left Eye Distance:   Bilateral Distance:    Right Eye Near:   Left Eye Near:    Bilateral Near:     Physical Exam Vitals and nursing note reviewed.  Constitutional:      Appearance: Normal appearance. He is well-developed.  HENT:     Head: Normocephalic and atraumatic.  Cardiovascular:     Rate and Rhythm: Normal rate.     Pulses:          Dorsalis pedis pulses are 2+ on the right side.        Posterior tibial pulses are 2+ on the right side.  Pulmonary:     Effort: Pulmonary effort is normal.  Musculoskeletal:        General: Tenderness present. No swelling. Normal range of motion.     Cervical back: Normal range of motion.     Comments: Right foot: tenderness to medial dorsal aspect at base of medial malleolus and into mid foot. Full ROM ankle and toes. Calf is soft, non-tender.  Skin:    General: Skin is warm and dry.     Capillary Refill: Capillary refill takes less than 2 seconds.     Comments: Right foot: faint ecchymosis to proximal dorsal medial aspect, base of medial malleolus.  Neurological:     Mental Status: He is alert and oriented to person, place, and time.     Sensory: No sensory deficit.  Psychiatric:        Behavior: Behavior normal.      UC Treatments / Results  Labs (all labs ordered are listed, but only abnormal results are displayed) Labs Reviewed - No data to display  EKG   Radiology DG Ankle Complete Right  Result Date: 01/11/2020 CLINICAL DATA:  Fall, pain EXAM: RIGHT ANKLE - COMPLETE 3+ VIEW COMPARISON:  None. FINDINGS: There is no evidence of fracture, dislocation, or joint effusion. There is no evidence of arthropathy or other focal bone abnormality. Soft tissues are unremarkable. IMPRESSION: Negative. Electronically Signed   By: 01/13/2020 M.D.   On: 01/11/2020 09:59   DG Foot Complete Right  Result Date: 01/11/2020 CLINICAL DATA:  Fall, pain EXAM: RIGHT FOOT COMPLETE - 3+ VIEW COMPARISON:  None. FINDINGS: There is no evidence of fracture or dislocation. There is no evidence of arthropathy or other focal bone abnormality. Soft tissues are unremarkable. IMPRESSION: Negative. Electronically Signed   By: 01/13/2020 M.D.   On: 01/11/2020 09:59    Procedures Procedures (including critical care time)  Medications Ordered in UC Medications - No data to display  Initial Impression / Assessment and Plan / UC Course  I have  reviewed the triage vital signs and the nursing notes.  Pertinent labs & imaging results that were available during my care of the patient were reviewed by me and considered in my medical decision making (see chart for details).     Discussed  imaging with pt Will tx as mild sprain.  ASO applied, pt has crutches at home. F/u sports medicine in 1-2 weeks if needed AVS provided  Final Clinical Impressions(s) / UC Diagnoses   Final diagnoses:  Right foot sprain, initial encounter  Acute right ankle pain     Discharge Instructions      You may take 500mg  acetaminophen every 4-6 hours or in combination with ibuprofen 400-600mg  every 6-8 hours as needed for pain and inflammation.  Call to schedule a follow up with Sports Medicine in 1-2 weeks if not improving.     ED Prescriptions    None     PDMP not reviewed this encounter.   , PA-C 01/11/20 1017

## 2020-04-04 DIAGNOSIS — Z79899 Other long term (current) drug therapy: Secondary | ICD-10-CM | POA: Diagnosis not present

## 2020-04-04 DIAGNOSIS — Z23 Encounter for immunization: Secondary | ICD-10-CM | POA: Diagnosis not present

## 2020-04-04 DIAGNOSIS — B20 Human immunodeficiency virus [HIV] disease: Secondary | ICD-10-CM | POA: Diagnosis not present

## 2021-03-23 IMAGING — DX DG FOOT COMPLETE 3+V*R*
3 series · 3 of 3 positions shown · non-contrast
Comparison: None.

CLINICAL DATA: Fall, pain

EXAM:
RIGHT FOOT COMPLETE - 3+ VIEW

[foot ap]
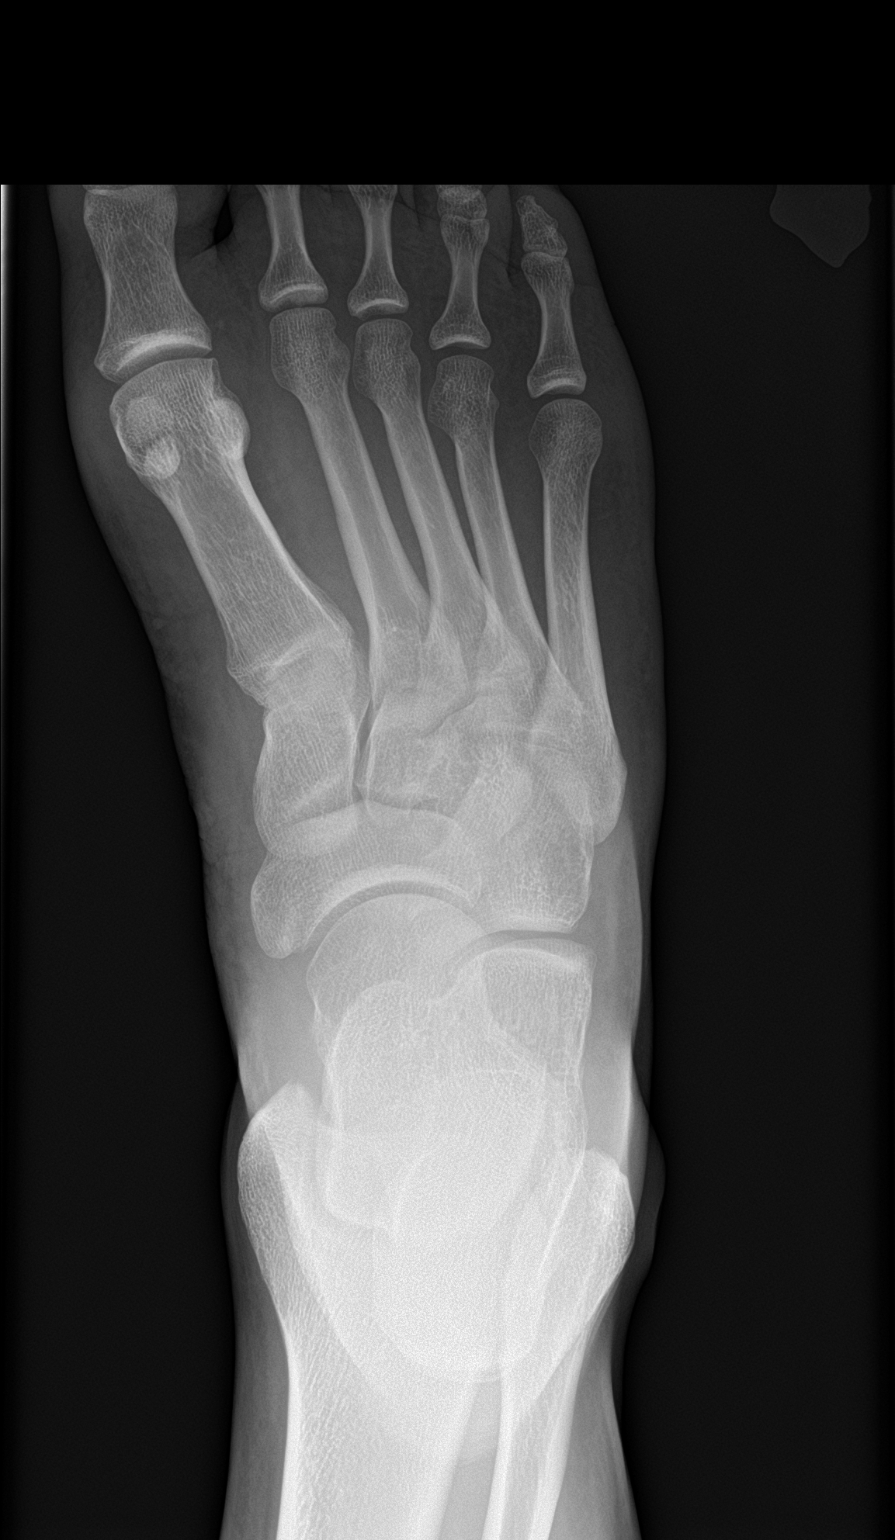

[foot obl]
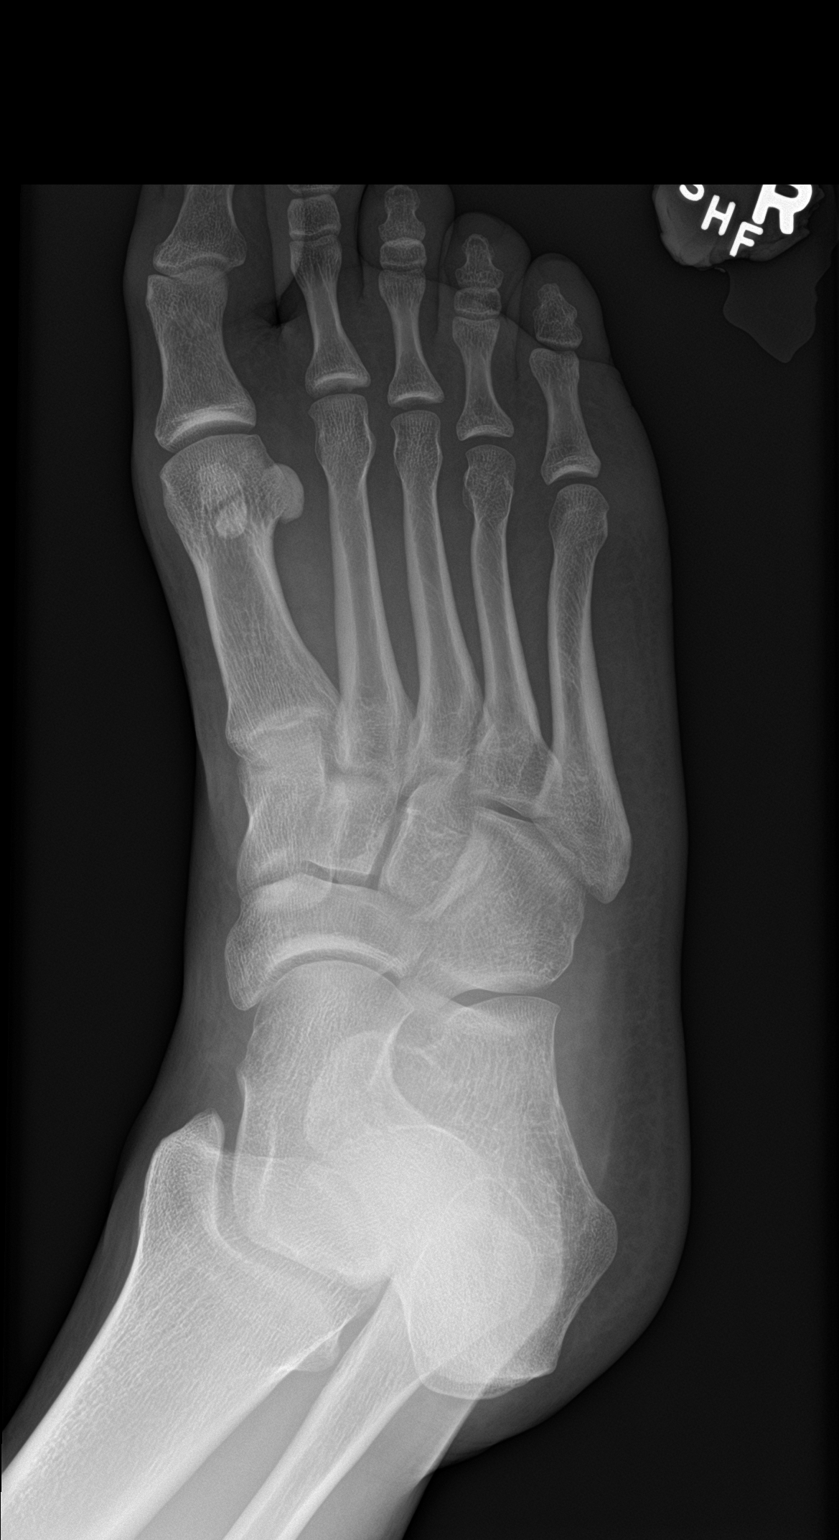

[foot lat]
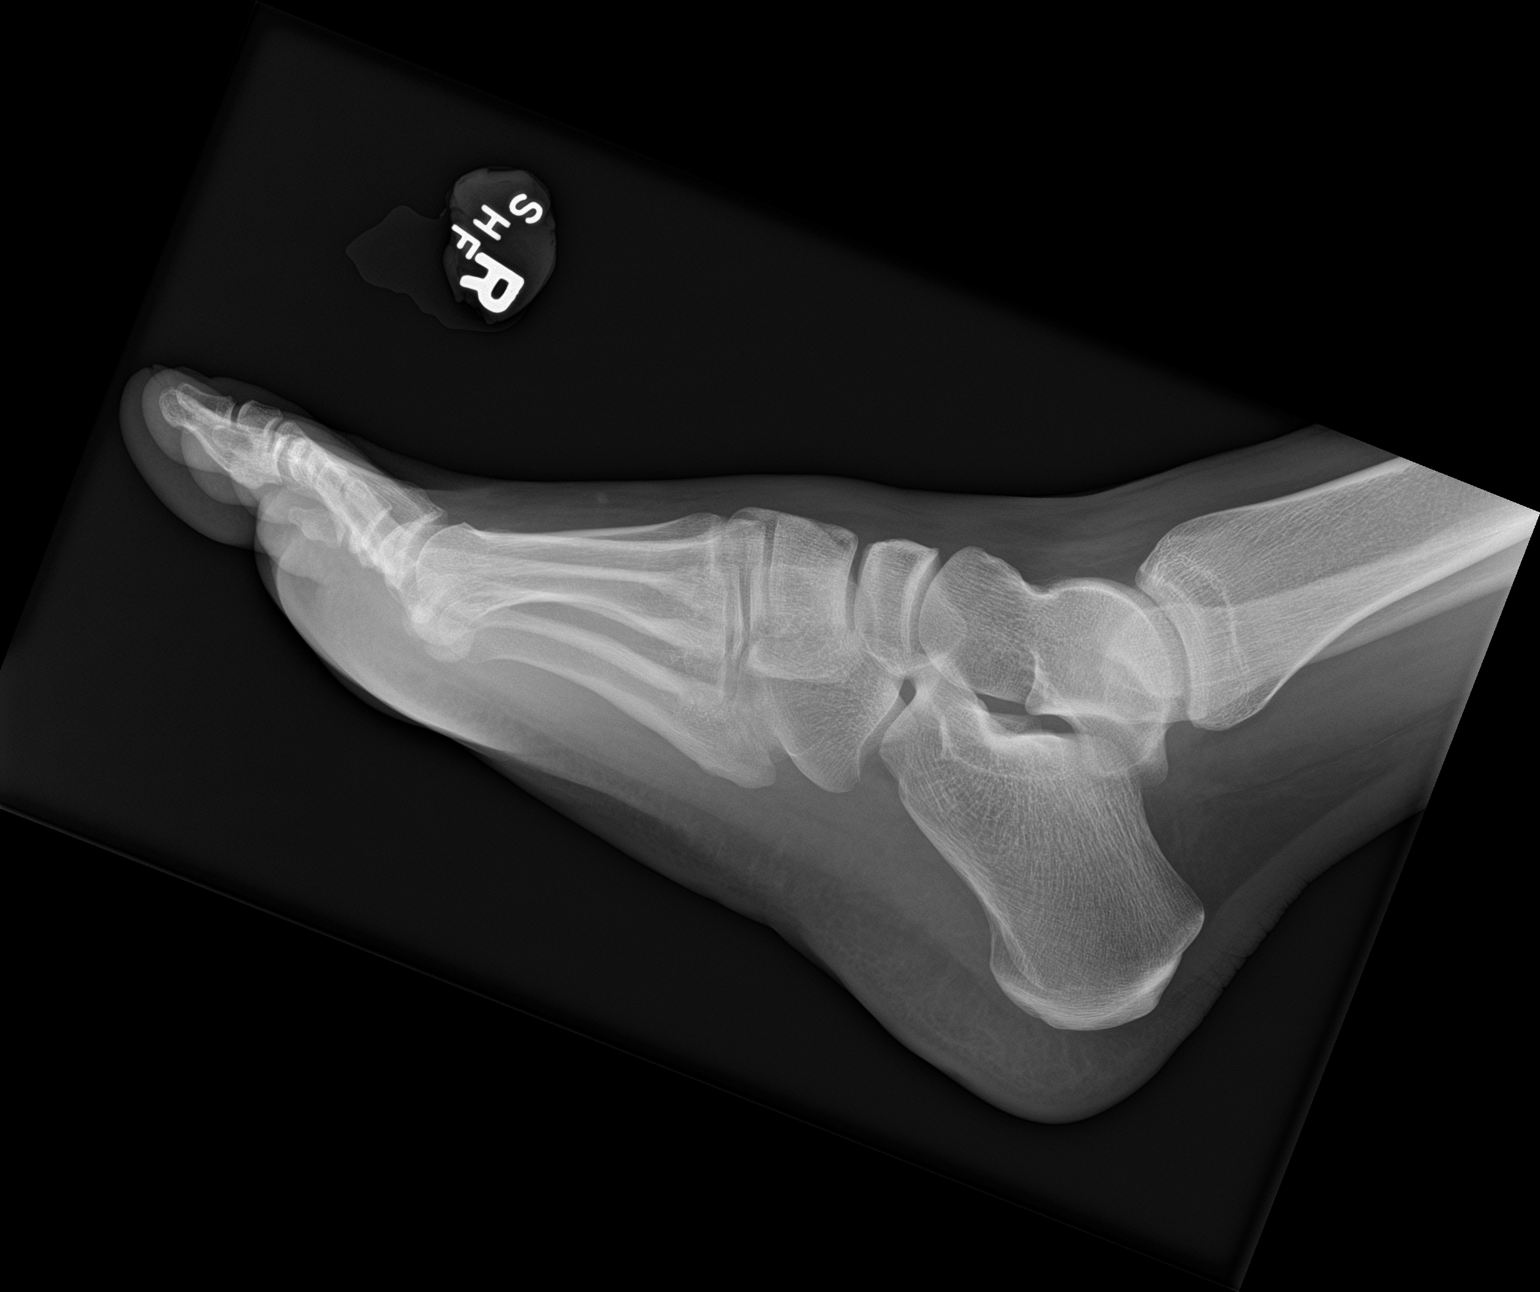

[3 of 3 positions shown; findings below may reference images not displayed]

FINDINGS: There is no evidence of fracture or dislocation. There is no
evidence of arthropathy or other focal bone abnormality. Soft
tissues are unremarkable.
IMPRESSION: Negative.
